# Patient Record
Sex: Female | Born: 1955 | Race: Black or African American | Hispanic: No | Marital: Married | Smoking: Never smoker
Health system: Southern US, Community
[De-identification: ages and names within clinical notes are randomized; demographics above are authoritative.]

## PROBLEM LIST (undated history)

## (undated) DIAGNOSIS — K589 Irritable bowel syndrome without diarrhea: Secondary | ICD-10-CM

## (undated) DIAGNOSIS — C73 Malignant neoplasm of thyroid gland: Secondary | ICD-10-CM

## (undated) DIAGNOSIS — E669 Obesity, unspecified: Secondary | ICD-10-CM

## (undated) DIAGNOSIS — E042 Nontoxic multinodular goiter: Secondary | ICD-10-CM

## (undated) DIAGNOSIS — K219 Gastro-esophageal reflux disease without esophagitis: Secondary | ICD-10-CM

## (undated) DIAGNOSIS — R1319 Other dysphagia: Secondary | ICD-10-CM

## (undated) DIAGNOSIS — E209 Hypoparathyroidism, unspecified: Secondary | ICD-10-CM

## (undated) DIAGNOSIS — J309 Allergic rhinitis, unspecified: Secondary | ICD-10-CM

## (undated) DIAGNOSIS — E890 Postprocedural hypothyroidism: Secondary | ICD-10-CM

## (undated) HISTORY — DX: Gastro-esophageal reflux disease without esophagitis: K21.9

## (undated) HISTORY — DX: Allergic rhinitis, unspecified: J30.9

## (undated) HISTORY — DX: Obesity, unspecified: E66.9

## (undated) HISTORY — DX: Nontoxic multinodular goiter: E04.2

## (undated) HISTORY — DX: Irritable bowel syndrome, unspecified: K58.9

## (undated) HISTORY — DX: Other dysphagia: R13.19

## (undated) HISTORY — DX: Postprocedural hypothyroidism: E89.0

## (undated) HISTORY — DX: Hypoparathyroidism, unspecified: E20.9

## (undated) HISTORY — DX: Malignant neoplasm of thyroid gland: C73

## (undated) HISTORY — PX: OTHER SURGICAL HISTORY: SHX169

---

## 1998-12-06 ENCOUNTER — Other Ambulatory Visit: Admission: RE | Admit: 1998-12-06 | Discharge: 1998-12-06 | Payer: Self-pay | Admitting: *Deleted

## 2000-02-26 ENCOUNTER — Other Ambulatory Visit: Admission: RE | Admit: 2000-02-26 | Discharge: 2000-02-26 | Payer: Self-pay | Admitting: Gynecology

## 2001-10-25 ENCOUNTER — Other Ambulatory Visit: Admission: RE | Admit: 2001-10-25 | Discharge: 2001-10-25 | Payer: Self-pay | Admitting: Obstetrics and Gynecology

## 2002-09-24 ENCOUNTER — Ambulatory Visit (HOSPITAL_COMMUNITY): Admission: RE | Admit: 2002-09-24 | Discharge: 2002-09-24 | Payer: Self-pay | Admitting: Family Medicine

## 2002-09-24 ENCOUNTER — Encounter: Payer: Self-pay | Admitting: Family Medicine

## 2003-10-24 ENCOUNTER — Other Ambulatory Visit: Admission: RE | Admit: 2003-10-24 | Discharge: 2003-10-24 | Payer: Self-pay | Admitting: Family Medicine

## 2004-08-14 ENCOUNTER — Encounter: Admission: RE | Admit: 2004-08-14 | Discharge: 2004-11-12 | Payer: Self-pay | Admitting: Family Medicine

## 2004-10-24 ENCOUNTER — Other Ambulatory Visit: Admission: RE | Admit: 2004-10-24 | Discharge: 2004-10-24 | Payer: Self-pay | Admitting: Family Medicine

## 2005-12-24 ENCOUNTER — Encounter: Admission: RE | Admit: 2005-12-24 | Discharge: 2005-12-24 | Payer: Self-pay | Admitting: Family Medicine

## 2006-07-02 ENCOUNTER — Other Ambulatory Visit: Admission: RE | Admit: 2006-07-02 | Discharge: 2006-07-02 | Payer: Self-pay | Admitting: Family Medicine

## 2006-08-08 ENCOUNTER — Encounter: Payer: Self-pay | Admitting: Endocrinology

## 2007-01-31 ENCOUNTER — Encounter: Admission: RE | Admit: 2007-01-31 | Discharge: 2007-01-31 | Payer: Self-pay | Admitting: Orthopaedic Surgery

## 2007-08-09 ENCOUNTER — Other Ambulatory Visit: Admission: RE | Admit: 2007-08-09 | Discharge: 2007-08-09 | Payer: Self-pay | Admitting: Family Medicine

## 2007-08-12 ENCOUNTER — Encounter: Payer: Self-pay | Admitting: Endocrinology

## 2007-08-12 ENCOUNTER — Encounter: Admission: RE | Admit: 2007-08-12 | Discharge: 2007-08-12 | Payer: Self-pay | Admitting: Family Medicine

## 2007-08-12 HISTORY — PX: OTHER SURGICAL HISTORY: SHX169

## 2007-08-26 ENCOUNTER — Encounter: Admission: RE | Admit: 2007-08-26 | Discharge: 2007-08-26 | Payer: Self-pay | Admitting: Gastroenterology

## 2007-10-14 ENCOUNTER — Encounter (INDEPENDENT_AMBULATORY_CARE_PROVIDER_SITE_OTHER): Payer: Self-pay | Admitting: Interventional Radiology

## 2007-10-14 ENCOUNTER — Encounter: Admission: RE | Admit: 2007-10-14 | Discharge: 2007-10-14 | Payer: Self-pay | Admitting: Endocrinology

## 2007-10-14 ENCOUNTER — Encounter: Payer: Self-pay | Admitting: Endocrinology

## 2007-10-14 ENCOUNTER — Other Ambulatory Visit: Admission: RE | Admit: 2007-10-14 | Discharge: 2007-10-14 | Payer: Self-pay | Admitting: Interventional Radiology

## 2007-10-14 HISTORY — PX: BIOPSY THYROID: PRO38

## 2007-11-07 ENCOUNTER — Encounter: Payer: Self-pay | Admitting: Endocrinology

## 2007-11-08 DIAGNOSIS — K219 Gastro-esophageal reflux disease without esophagitis: Secondary | ICD-10-CM

## 2007-11-08 HISTORY — DX: Gastro-esophageal reflux disease without esophagitis: K21.9

## 2007-11-09 ENCOUNTER — Ambulatory Visit: Payer: Self-pay | Admitting: Endocrinology

## 2007-11-09 DIAGNOSIS — R1319 Other dysphagia: Secondary | ICD-10-CM

## 2007-11-09 HISTORY — DX: Other dysphagia: R13.19

## 2007-11-14 ENCOUNTER — Encounter (INDEPENDENT_AMBULATORY_CARE_PROVIDER_SITE_OTHER): Payer: Self-pay | Admitting: *Deleted

## 2007-12-14 ENCOUNTER — Encounter: Payer: Self-pay | Admitting: Endocrinology

## 2008-02-09 ENCOUNTER — Encounter (HOSPITAL_BASED_OUTPATIENT_CLINIC_OR_DEPARTMENT_OTHER): Payer: Self-pay | Admitting: General Surgery

## 2008-02-10 ENCOUNTER — Inpatient Hospital Stay (HOSPITAL_COMMUNITY): Admission: AD | Admit: 2008-02-10 | Discharge: 2008-02-11 | Payer: Self-pay | Admitting: General Surgery

## 2008-02-15 ENCOUNTER — Telehealth (INDEPENDENT_AMBULATORY_CARE_PROVIDER_SITE_OTHER): Payer: Self-pay | Admitting: *Deleted

## 2008-02-20 ENCOUNTER — Telehealth: Payer: Self-pay | Admitting: Endocrinology

## 2008-02-20 ENCOUNTER — Ambulatory Visit: Payer: Self-pay | Admitting: Endocrinology

## 2008-02-20 DIAGNOSIS — E209 Hypoparathyroidism, unspecified: Secondary | ICD-10-CM

## 2008-02-20 DIAGNOSIS — E890 Postprocedural hypothyroidism: Secondary | ICD-10-CM

## 2008-02-20 DIAGNOSIS — C73 Malignant neoplasm of thyroid gland: Secondary | ICD-10-CM

## 2008-02-20 HISTORY — DX: Postprocedural hypothyroidism: E89.0

## 2008-02-20 HISTORY — DX: Hypoparathyroidism, unspecified: E20.9

## 2008-02-20 HISTORY — DX: Malignant neoplasm of thyroid gland: C73

## 2008-02-27 ENCOUNTER — Ambulatory Visit: Payer: Self-pay | Admitting: Endocrinology

## 2008-02-27 DIAGNOSIS — R079 Chest pain, unspecified: Secondary | ICD-10-CM | POA: Insufficient documentation

## 2008-03-01 ENCOUNTER — Telehealth (INDEPENDENT_AMBULATORY_CARE_PROVIDER_SITE_OTHER): Payer: Self-pay | Admitting: *Deleted

## 2008-03-13 ENCOUNTER — Ambulatory Visit: Payer: Self-pay | Admitting: Endocrinology

## 2008-03-22 ENCOUNTER — Encounter (HOSPITAL_COMMUNITY): Admission: RE | Admit: 2008-03-22 | Discharge: 2008-04-26 | Payer: Self-pay | Admitting: Endocrinology

## 2008-03-23 ENCOUNTER — Ambulatory Visit: Payer: Self-pay | Admitting: Endocrinology

## 2008-03-23 ENCOUNTER — Telehealth: Payer: Self-pay | Admitting: Endocrinology

## 2008-03-23 ENCOUNTER — Telehealth (INDEPENDENT_AMBULATORY_CARE_PROVIDER_SITE_OTHER): Payer: Self-pay | Admitting: *Deleted

## 2008-03-23 DIAGNOSIS — R519 Headache, unspecified: Secondary | ICD-10-CM | POA: Insufficient documentation

## 2008-03-23 DIAGNOSIS — R51 Headache: Secondary | ICD-10-CM | POA: Insufficient documentation

## 2008-03-23 DIAGNOSIS — K1120 Sialoadenitis, unspecified: Secondary | ICD-10-CM | POA: Insufficient documentation

## 2008-03-23 LAB — CONVERTED CEMR LAB: Amylase: 989 units/L — ABNORMAL HIGH (ref 27–131)

## 2008-04-06 ENCOUNTER — Ambulatory Visit: Payer: Self-pay | Admitting: Endocrinology

## 2008-04-27 ENCOUNTER — Ambulatory Visit: Payer: Self-pay | Admitting: Endocrinology

## 2008-04-27 LAB — CONVERTED CEMR LAB: TSH: 0.1 microintl units/mL — ABNORMAL LOW (ref 0.35–5.50)

## 2008-05-23 ENCOUNTER — Ambulatory Visit: Payer: Self-pay | Admitting: Endocrinology

## 2008-05-23 LAB — CONVERTED CEMR LAB: TSH: 0.03 microintl units/mL — ABNORMAL LOW (ref 0.35–5.50)

## 2008-05-28 ENCOUNTER — Ambulatory Visit: Payer: Self-pay | Admitting: Endocrinology

## 2008-05-28 DIAGNOSIS — R209 Unspecified disturbances of skin sensation: Secondary | ICD-10-CM | POA: Insufficient documentation

## 2008-05-28 LAB — CONVERTED CEMR LAB
Calcium, Total (PTH): 9.1 mg/dL (ref 8.4–10.5)
PTH: 26.1 pg/mL (ref 14.0–72.0)

## 2008-06-06 LAB — CONVERTED CEMR LAB
Basophils Absolute: 0 10*3/uL (ref 0.0–0.1)
Basophils Relative: 0.7 % (ref 0.0–3.0)
Eosinophils Absolute: 0.1 10*3/uL (ref 0.0–0.7)
Eosinophils Relative: 1.8 % (ref 0.0–5.0)
Folate: 12 ng/mL
HCT: 37.6 % (ref 36.0–46.0)
Hemoglobin: 12.7 g/dL (ref 12.0–15.0)
Lymphocytes Relative: 30.3 % (ref 12.0–46.0)
MCHC: 33.8 g/dL (ref 30.0–36.0)
MCV: 83.7 fL (ref 78.0–100.0)
Monocytes Absolute: 0.3 10*3/uL (ref 0.1–1.0)
Monocytes Relative: 6.6 % (ref 3.0–12.0)
Neutro Abs: 2.2 10*3/uL (ref 1.4–7.7)
Neutrophils Relative %: 60.6 % (ref 43.0–77.0)
Platelets: 267 10*3/uL (ref 150–400)
RBC: 4.5 M/uL (ref 3.87–5.11)
RDW: 12.4 % (ref 11.5–14.6)
Sed Rate: 19 mm/hr (ref 0–22)
Vitamin B-12: 298 pg/mL (ref 211–911)
WBC: 3.8 10*3/uL — ABNORMAL LOW (ref 4.5–10.5)

## 2008-06-20 ENCOUNTER — Telehealth (INDEPENDENT_AMBULATORY_CARE_PROVIDER_SITE_OTHER): Payer: Self-pay | Admitting: *Deleted

## 2008-08-15 ENCOUNTER — Ambulatory Visit: Payer: Self-pay | Admitting: Endocrinology

## 2008-08-15 LAB — CONVERTED CEMR LAB
Amylase: 111 units/L (ref 27–131)
TSH: 23.75 microintl units/mL — ABNORMAL HIGH (ref 0.35–5.50)

## 2008-08-16 ENCOUNTER — Encounter: Payer: Self-pay | Admitting: Endocrinology

## 2008-08-16 LAB — CONVERTED CEMR LAB
Calcium, Total (PTH): 8.7 mg/dL (ref 8.4–10.5)
PTH: 34.5 pg/mL (ref 14.0–72.0)

## 2008-08-20 ENCOUNTER — Telehealth (INDEPENDENT_AMBULATORY_CARE_PROVIDER_SITE_OTHER): Payer: Self-pay | Admitting: *Deleted

## 2008-08-24 ENCOUNTER — Telehealth (INDEPENDENT_AMBULATORY_CARE_PROVIDER_SITE_OTHER): Payer: Self-pay | Admitting: *Deleted

## 2008-08-27 ENCOUNTER — Encounter (HOSPITAL_COMMUNITY): Admission: RE | Admit: 2008-08-27 | Discharge: 2008-11-25 | Payer: Self-pay | Admitting: Endocrinology

## 2008-08-30 ENCOUNTER — Telehealth (INDEPENDENT_AMBULATORY_CARE_PROVIDER_SITE_OTHER): Payer: Self-pay | Admitting: *Deleted

## 2008-08-31 ENCOUNTER — Telehealth: Payer: Self-pay | Admitting: Endocrinology

## 2008-09-04 ENCOUNTER — Other Ambulatory Visit: Admission: RE | Admit: 2008-09-04 | Discharge: 2008-09-04 | Payer: Self-pay | Admitting: Family Medicine

## 2008-09-13 ENCOUNTER — Telehealth (INDEPENDENT_AMBULATORY_CARE_PROVIDER_SITE_OTHER): Payer: Self-pay | Admitting: *Deleted

## 2008-10-01 ENCOUNTER — Telehealth (INDEPENDENT_AMBULATORY_CARE_PROVIDER_SITE_OTHER): Payer: Self-pay | Admitting: *Deleted

## 2008-12-13 ENCOUNTER — Encounter: Admission: RE | Admit: 2008-12-13 | Discharge: 2008-12-13 | Payer: Self-pay | Admitting: Orthopaedic Surgery

## 2008-12-14 ENCOUNTER — Ambulatory Visit: Payer: Self-pay | Admitting: Endocrinology

## 2008-12-14 ENCOUNTER — Telehealth (INDEPENDENT_AMBULATORY_CARE_PROVIDER_SITE_OTHER): Payer: Self-pay | Admitting: *Deleted

## 2008-12-14 DIAGNOSIS — M2550 Pain in unspecified joint: Secondary | ICD-10-CM | POA: Insufficient documentation

## 2008-12-17 ENCOUNTER — Telehealth (INDEPENDENT_AMBULATORY_CARE_PROVIDER_SITE_OTHER): Payer: Self-pay | Admitting: *Deleted

## 2008-12-28 ENCOUNTER — Telehealth (INDEPENDENT_AMBULATORY_CARE_PROVIDER_SITE_OTHER): Payer: Self-pay | Admitting: *Deleted

## 2008-12-28 ENCOUNTER — Ambulatory Visit: Payer: Self-pay | Admitting: Endocrinology

## 2008-12-28 LAB — CONVERTED CEMR LAB: Amylase: 193 units/L — ABNORMAL HIGH (ref 27–131)

## 2009-01-01 ENCOUNTER — Telehealth (INDEPENDENT_AMBULATORY_CARE_PROVIDER_SITE_OTHER): Payer: Self-pay | Admitting: *Deleted

## 2009-01-04 ENCOUNTER — Telehealth (INDEPENDENT_AMBULATORY_CARE_PROVIDER_SITE_OTHER): Payer: Self-pay | Admitting: *Deleted

## 2009-01-14 ENCOUNTER — Other Ambulatory Visit: Admission: RE | Admit: 2009-01-14 | Discharge: 2009-01-14 | Payer: Self-pay | Admitting: Family Medicine

## 2009-01-21 ENCOUNTER — Telehealth: Payer: Self-pay | Admitting: Endocrinology

## 2009-01-22 ENCOUNTER — Encounter: Payer: Self-pay | Admitting: Endocrinology

## 2009-03-04 ENCOUNTER — Ambulatory Visit: Payer: Self-pay | Admitting: Internal Medicine

## 2009-03-20 ENCOUNTER — Ambulatory Visit: Payer: Self-pay | Admitting: Endocrinology

## 2009-03-20 LAB — CONVERTED CEMR LAB: TSH: 0.03 microintl units/mL — ABNORMAL LOW (ref 0.35–5.50)

## 2009-06-19 ENCOUNTER — Encounter: Payer: Self-pay | Admitting: Endocrinology

## 2009-06-20 ENCOUNTER — Ambulatory Visit: Payer: Self-pay | Admitting: Endocrinology

## 2009-06-24 LAB — CONVERTED CEMR LAB
Calcium, Total (PTH): 8.7 mg/dL (ref 8.4–10.5)
PTH: 18.3 pg/mL (ref 14.0–72.0)

## 2009-07-10 ENCOUNTER — Ambulatory Visit: Payer: Self-pay | Admitting: Endocrinology

## 2009-07-10 DIAGNOSIS — M5412 Radiculopathy, cervical region: Secondary | ICD-10-CM | POA: Insufficient documentation

## 2009-08-12 ENCOUNTER — Telehealth: Payer: Self-pay | Admitting: Endocrinology

## 2009-09-05 ENCOUNTER — Encounter: Payer: Self-pay | Admitting: Endocrinology

## 2009-09-09 ENCOUNTER — Telehealth: Payer: Self-pay | Admitting: Endocrinology

## 2009-12-10 ENCOUNTER — Ambulatory Visit: Payer: Self-pay | Admitting: Endocrinology

## 2009-12-10 LAB — CONVERTED CEMR LAB
Calcium, Total (PTH): 8.2 mg/dL — ABNORMAL LOW (ref 8.4–10.5)
PTH: 32.9 pg/mL (ref 14.0–72.0)
TSH: 0.18 microintl units/mL — ABNORMAL LOW (ref 0.35–5.50)
Thyroglobulin Ab: <30 (ref 0.0–60.0)

## 2010-01-13 ENCOUNTER — Encounter: Admission: RE | Admit: 2010-01-13 | Discharge: 2010-01-13 | Payer: Self-pay | Admitting: Gastroenterology

## 2010-04-07 ENCOUNTER — Telehealth (INDEPENDENT_AMBULATORY_CARE_PROVIDER_SITE_OTHER): Payer: Self-pay | Admitting: *Deleted

## 2010-09-07 LAB — CONVERTED CEMR LAB
Sed Rate: 23 mm/hr — ABNORMAL HIGH (ref 0–22)
TSH: 0.11 microintl units/mL — ABNORMAL LOW (ref 0.35–5.50)
TSH: 45.22 microintl units/mL — ABNORMAL HIGH (ref 0.35–5.50)

## 2010-09-11 NOTE — Progress Notes (Signed)
Summary: Thyroid meds  Phone Note Call from Patient   Caller: Patient [phone] Summary of Call: pt called stating that she was seen at her PCP and had her labs done. Pt's PCP told pt that she is taking too much Thyroid medication (labs faxed and on MD's desk for review). pt is requesting MD review and adjust her medications if needed. please advise Initial call taken by: Margaret Pyle, CMA,  September 09, 2009 9:21 AM  Follow-up for Phone Call        i called pt 09/09/09.  no answer.  i left message.  the tsh should be kept slightly below the "normal" range, as doing so has been found to reduce the recurrence of thyroid cancer.  please continue same dosage. Follow-up by: Minus Breeding MD,  September 09, 2009 5:24 PM

## 2010-09-11 NOTE — Consult Note (Signed)
Summary: [facility]   Imported By: Esmeralda Links D'jimraou 01/04/2008 15:27:31  _____________________________________________________________________  External Attachment:    Type:   Image     Comment:   External Document

## 2010-09-11 NOTE — Progress Notes (Signed)
  Phone Note Call from Patient Call back at Home Phone [phone]   Caller: [phone] Call For: dr ellision  Complaint: Chest Pain Summary of Call: per Audria Nine call  at the end of day her energy levels drops ,what should she do about this , saw dr elllision today , should she  increase her sythroid .. what does dr ellsion recommended Initial call taken by: Shelbie Proctor,  Dec 14, 2008 2:38 PM  Follow-up for Phone Call        it will take 1-2 weeks for today's adjustment to help you feel better.   Follow-up by: Minus Breeding MD,  Dec 14, 2008 2:45 PM  Additional Follow-up for Phone Call Additional follow up Details #1::        pt states dr ellsion made no changes to her medication does understand what he means by- it will take 1-2 weeks for today's adjustment to help you feel better.  Additional Follow-up by: Shelbie Proctor,  Dec 14, 2008 3:58 PM    Additional Follow-up for Phone Call Additional follow up Details #2::    i left message on phone-tree Follow-up by: Minus Breeding MD,  Dec 14, 2008 4:10 PM  Additional Follow-up for Phone Call Additional follow up Details #3:: Details for Additional Follow-up Action Taken: called pt to inform to listen to the phone msg Additional Follow-up by: Shelbie Proctor,  Dec 14, 2008 4:33 PM

## 2010-09-11 NOTE — Progress Notes (Signed)
Summary: ? about appt  Phone Note Call from Patient Call back at [facility] Phone [phone]   Caller: [phone] Call For: dr ellsion Summary of Call: per Audria Nine call want to know if it is nessary to keep her appt on Next Appointment: 01/02/2009,7:45 am,ENDOCRINOLOGY .. she was just seen  Last Appointment: 12/28/2008,4:00 pm,  Initial call taken by: Shelbie Proctor,  Jan 01, 2009 9:54 AM  Follow-up for Phone Call        no, please return here nov 2010 Follow-up by: Minus Breeding MD,  Jan 01, 2009 11:08 AM  Additional Follow-up for Phone Call Additional follow up Details #1::        called pt to inform  spoke with pt to inform sharon scheduled pt  Additional Follow-up by: Shelbie Proctor,  Jan 01, 2009 11:23 AM

## 2010-09-11 NOTE — Progress Notes (Signed)
Summary: Iodine med  Phone Note Call from Patient Call back at Home Phone [phone]   Caller: Patient Summary of Call: Per pt was advised ok to take an Iodine pill. She would like  to know how long would she need to be away from people while on med. She plans to return to work 02/27/08 Initial call taken by: Rock Nephew CMA,  February 20, 2008 1:52 PM  Follow-up for Phone Call        3 days Follow-up by: Minus Breeding MD,  February 20, 2008 1:58 PM  Additional Follow-up for Phone Call Additional follow up Details #1::        Patient notify Additional Follow-up by: Rock Nephew CMA,  February 20, 2008 3:09 PM

## 2010-09-11 NOTE — Assessment & Plan Note (Signed)
Summary: ANXIETY/$50 /NWS   Vital Signs:  Patient Profile:   55 Years Old Female Weight:      177.4 pounds O2 Sat:      97 % O2 treatment:    Room Air Temp:     97.0 degrees F oral Pulse rate:   71 / minute BP sitting:   140 / 88  (left arm) Cuff size:   regular  Pt. in pain?   no  Vitals Entered By: Orlan Leavens (May 23, 2008 10:29 AM)                  Referred by:  Arvilla Market  Chief Complaint:  Anxiety.  History of Present Illness: takes synthroid as rx'ed. she states few weeks of cold intolerance and anxiety.  no insomnia.    Prior Medications Reviewed Using: Patient Recall  Prior Medication List:  CLIDINIUM-CHLORDIAZEPOXIDE 2.5-5 MG  CAPS (CLIDINIUM-CHLORDIAZEPOXIDE) TAKE 1 by mouth QID PRN CALCITRIOL 0.25 MCG  CAPS (CALCITRIOL) TAKE 2 by mouth QD [facility] CALCIUM CARBONATE/VIT D 600-125 MG-UNIT  TABS (CALCIUM CARBONATE-VITAMIN D) TAKE 2 QID SYNTHROID 125 MCG  TABS (LEVOTHYROXINE SODIUM) qd   Updated Prior Medication List: CLIDINIUM-CHLORDIAZEPOXIDE 2.5-5 MG  CAPS (CLIDINIUM-CHLORDIAZEPOXIDE) TAKE 1 by mouth QID PRN CALCITRIOL 0.25 MCG  CAPS (CALCITRIOL) TAKE 2 by mouth QD [facility] CALCIUM CARBONATE/VIT D 600-125 MG-UNIT  TABS (CALCIUM CARBONATE-VITAMIN D) TAKE 2 QID SYNTHROID 125 MCG  TABS (LEVOTHYROXINE SODIUM) qd  Current Allergies (reviewed today): ! 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 Qd  Other Orders: T-Parathyroid Hormone, Intact w/ Calcium ([id number])   Patient Instructions: 1)  reduce synthroid to 112/d. 2)  f/u bp with dr Arvilla Market.   Prescriptions: LEVOTHYROXINE SODIUM 112 MCG TABS (LEVOTHYROXINE SODIUM) qd  #30 x 5   Entered and Authorized by:   Minus Breeding MD   Signed by:   Minus Breeding MD on 05/23/2008   Method used:   [facility]  [address] [phone]* (retail)       [address]  [place], [place]  [address]       Ph: [phone] or [phone]       Fax: [phone]   RxID:   [id number]  ]

## 2010-09-11 NOTE — Assessment & Plan Note (Signed)
Summary: Vickie Valdez  ST[facility]   Vital Signs:  Patient profile:   55 year old female Height:      68 inches (172.72 cm) Weight:      182.6 pounds (83.00 kg) BMI:     27.86 O2 Sat:      96 % Temp:     98.5 degrees F (36.94 degrees C) oral Pulse rate:   76 / minute BP sitting:   120 / 80  (left arm) Cuff size:   regular  Vitals Entered By: Orlan Leavens (Dec 14, 2008 8:34 AM) Valdez Assessment Patient in Valdez? yes     Location: [facility] Type: aching   Referring Provider:  Arvilla Market Primary Provider:  Arvilla Market   History of Present Illness: 2 days of Valdez at the flexor surfaces of the elbows.  no associated numbness.  unable to cite precip cause she feels very tired at the end of the day.  Current Medications (verified): 1)  Clidinium-Chlordiazepoxide 2.5-5 Mg  Caps (Clidinium-Chlordiazepoxide) .... Take 1 By Mouth Qid Prn 2)  Levothyroxine Sodium 150 Mcg Tabs (Levothyroxine Sodium) .... Qd 3)  Protonix 40 Mg Tbec (Pantoprazole Sodium) .... Take 1 By Mouth Qd 4)  Multivitamins  Tabs (Multiple Vitamin) .... Take 1 By Mouth Qd  Allergies (verified): 1)  ! Penicillin  Comments:  Nurse/Medical Assistant: The patient's medications and allergies were reviewed with the patient and were updated in the Medication and Allergy Lists. 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         denies rash  Physical Exam  General:  normal  appearance.   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 Take 1 by mouth qd 2)  Multivitamins Tabs (Multiple vitamin) .... Take 1 by mouth qd  Other Orders: T- * Misc. Laboratory test [phone]) EKG w/ Interpretation ([address]) TLB-TSH (Thyroid Stimulating Hormone) (84443-TSH) TLB-Sedimentation Rate (ESR) (85652-ESR) Est. Patient Level IV (86578)  Patient Instructions: 1)  i gave pt the option of keeping tsh supppressed (preferred), or try keeping it normal if you think this would help you feel better. 2)  ret as scheduled 3)  i offered ref ortho, but it is best to f/u with dr Arvilla Market.

## 2010-09-11 NOTE — Progress Notes (Signed)
  Phone Note Call from Patient Call back at Home Phone [phone]   Caller: [phone] Call For: dr ellsion Summary of Call: per Audria Nine call heard a phone msg by dr ellsion that he was oing to increase her LEVOTHYROXINE SODIUM 150 MCG TABS (LEVOTHYROXINE SODIUM) ...[facility] [address]* (retail) [address] [place], [place]  [address] Ph: [phone] or [phone] Fax: [phone] Initial call taken by: Shelbie Proctor,  Dec 17, 2008 10:18 AM  Follow-up for Phone Call        sent Follow-up by: Minus Breeding MD,  Dec 17, 2008 10:47 AM  Additional Follow-up for Phone Call Additional follow up Details #1::        called pt to inform rx was sent to the pharmacy Additional Follow-up by: Shelbie Proctor,  Dec 17, 2008 11:00 AM    New/Updated Medications: LEVOTHYROXINE SODIUM 137 MCG TABS (LEVOTHYROXINE SODIUM) 1 qd   Prescriptions: LEVOTHYROXINE SODIUM 137 MCG TABS (LEVOTHYROXINE SODIUM) 1 qd  #30 x 11   Entered and Authorized by:   Minus Breeding MD   Signed by:   Minus Breeding MD on 12/17/2008   Method used:   [facility]ronically to        [facility] [phone]* (retail)       [address]       [place], [place]  [address]       Ph: [phone] or [phone]       Fax: [phone]   RxID:   [id number]

## 2010-09-11 NOTE — Progress Notes (Signed)
  Phone Note Other Incoming   Request: Send information Summary of Call: Request for records received from [facility]. Request forwarded to [facility].     

## 2010-09-11 NOTE — Progress Notes (Signed)
Summary: Appt  Phone Note From Other Clinic   Caller: Kelena Garrow Call For: Dr. Everardo All Summary of Call: Call from Dr. Leonie Man. This is concerning a patient of Dr. Everardo All. Dr. Everardo All sent the patient  to Dr. Lurene Shadow for surgery. She needs an appointment with Dr. Everardo All as soon as possible. This was her first visit to review pathology. She has papillary thyroid cancer. Patient is aware of diagnosis. The patient's telephone number is [phone].  Initial call taken by: Jerilynn Mages, MA,  February 15, 2008 3:22 PM  Follow-up for Phone Call        ov 7/9/9 Follow-up by: Minus Breeding MD,  February 15, 2008 4:23 PM  Additional Follow-up for Phone Call Additional follow up Details #1::        Pt called and scheduled appt w/Dr Everardo All on monday 7/13 Additional Follow-up by: Verdell Face,  February 16, 2008 9:05 AM

## 2010-09-11 NOTE — Progress Notes (Signed)
  Phone Note Refill Request Message from:  Fax from Pharmacy on August 12, 2009 2:19 PM  Refills Requested: Medication #1:  SYNTHROID 112 MCG TABS 1 qd   Dosage confirmed as above?Dosage Confirmed Initial call taken by: Josph Macho CMA,  August 12, 2009 2:20 PM    Prescriptions: SYNTHROID 112 MCG TABS (LEVOTHYROXINE SODIUM) 1 qd  #90 x 2   Entered by:   Josph Macho CMA   Authorized by:   Minus Breeding MD   Signed by:   Josph Macho CMA on 08/12/2009   Method used:   Faxed to ...       [facility] (mail-order)             , [place]         Ph:        Fax: [phone]   RxID:   [id number]

## 2010-09-11 NOTE — Assessment & Plan Note (Signed)
Summary: DISCOMFORT IN FRONT OF NECK/TINGLING IN LEG /ALL WEEK/NWS $50   Vital Signs:  Patient Profile:   55 Years Old Female Weight:      184.8 pounds Temp:     98.4 degrees F oral Pulse rate:   73 / minute BP sitting:   118 / 78  (left arm) Cuff size:   regular  Pt. in pain?   no  Vitals Entered By: Orlan Leavens (April 06, 2008 8:20 AM)              Is Patient Diabetic? No     Referred by:  Arvilla Market  Chief Complaint:  Discomfort in front of neck/ also been having tingling in (L) leg .  History of Present Illness: pt has much less pain and swelling at the parotid areas, but has slight pain at the anterior neck. now back on synthroid x 10d.     Current Allergies: ! PENICILLIN  Past Medical History:    Reviewed history from 02/20/2008 and no changes required:       HYPOTHYROIDISM, POSTSURGICAL (ICD-244.0)       HYPOPARATHYROIDISM (ICD-252.1)       THYROID CANCER (ICD-193)       6/09: thyroidectomy for 10 mm papillary ca, follicular variant       8/09: 104 [facility] i-131 rx       8/09: "superscan" pos only at the neck       OTHER DYSPHAGIA (ICD-787.29)       GOITER, MULTINODULAR (ICD-241.1)       GERD (ICD-530.81)            Review of Systems  The patient denies fever.     Physical Exam  General:     well developed, well nourished, in no acute distress Neck:     no masses, thyromegaly, tenderness, or abnormal cervical nodes    Impression & Recommendations:  Problem # 1:  HYPOTHYROIDISM, POSTSURGICAL (ICD-244.0) Assessment: Improved  Problem # 2:  SIALADENITIS (ICD-527.2) Assessment: Improved  Problem # 3:  THYROID CANCER (ICD-193)   Patient Instructions: 1)  tsh 30d 244.0 2)  ret 5 mos, when she will be due for a thyrogen scan   ]

## 2010-09-11 NOTE — Consult Note (Signed)
Summary: Dr. Arvilla Market  Dr. Arvilla Market   Imported By: Briant Cedar 11/17/2007 14:38:04  _____________________________________________________________________  External Attachment:    Type:   Image     Comment:   External Document

## 2010-09-11 NOTE — Progress Notes (Signed)
  Phone Note Call from Patient Call back at Home Phone [phone]   Caller: [phone] Summary of Call: pt requseting a pill  medication to help her relaxe when she does her xray body scan..............[facility] [phone]* (retail) [address] [place], [place]  [address] Ph: [phone] or [phone] Fax: [phone] Initial call taken by: Shelbie Proctor,  August 24, 2008 12:59 PM  Follow-up for Phone Call        good news--this test is easy--sedation is not needed Follow-up by: Minus Breeding MD,  August 24, 2008 1:40 PM  Additional Follow-up for Phone Call Additional follow up Details #1::        called pt  to inform pt made aware Additional Follow-up by: Shelbie Proctor,  August 24, 2008 1:46 PM

## 2010-09-11 NOTE — Assessment & Plan Note (Signed)
Summary: not feeling well/lb   Vital Signs:  Patient profile:   55 year old female Height:      63 inches Weight:      188.38 pounds BMI:     33.49 O2 Sat:      98 % on Room air Temp:     98.4 degrees F oral Pulse rate:   75 / minute BP sitting:   110 / 70  (left arm) Cuff size:   large  Vitals Entered By: Lucious Groves (Dec 10, 2009 2:00 PM)  O2 Flow:  Room air CC: Not feeling well--Pt c/o fatigue and upper arm achiness. Pt also states that she is "jittery"./kb Is Patient Diabetic? No Pain Assessment Patient in pain? no        Referring Provider:  Pincus Badder Primary Provider:  Pincus Badder  CC:  Not feeling well--Pt c/o fatigue and upper arm achiness. Pt also states that she is "jittery"./kb.  History of Present Illness: the status of at least 3 ongoing medical problems is addressed today: papillary adenocarcinoma of the thyroid:  pt says she does not notice any nodule at the neck. hypoparathyroidism:  she does not take a ca++ tab, other than what is in her multivitamin. postoperative hypothyroidism:  she takes synthroid as rx'ed.  she has fatigue.  Current Medications (verified): 1)  Clidinium-Chlordiazepoxide 2.5-5 Mg  Caps (Clidinium-Chlordiazepoxide) .... Take 1 By Mouth Qid Prn 2)  Multivitamins  Tabs (Multiple Vitamin) .... Take 1 By Mouth Qd 3)  Synthroid 112 Mcg Tabs (Levothyroxine Sodium) .Marland Kitchen.. 1 Qd 4)  Dexilant 60 Mg Cpdr (Dexlansoprazole) .Marland Kitchen.. 1 By Mouth Qd  Allergies (verified): 1)  ! 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  Social History: Reviewed history from 03/04/2009 and no changes required. married works for a and Social research officer, government, as an Production designer, theatre/television/film Never Smoked Alcohol use-no Drug use-no Regular exercise-yes  Review of Systems       The patient complains of weight gain.         denies neck pain.  Physical Exam  General:  obese.   Neck:  a healed scar is present.  i do not appreciate a nodule in the thyroid or elsewhere in the neck  Additional Exam:   FastTSH              [L]  0.18 uIU/mL   Thyroglobulin Antibody    <30.0 U/mL                  0.0-60.0 Thyroglobulin             <0.2 ng/mL                  0.0-55.0 Parathyroid Hormone       32.9 pg/mL                  14.0-72.0 Calcium              [L]  8.2 mg/dL    Impression & Recommendations:  Problem # 1:  THYROID CANCER (  ICD-193) stage 1.  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 1 by mouth qd  Other Orders: T-Thyroglobulin Panel ([address], [phone]) T-Parathyroid Hormone, Intact w/ Calcium ([id number]) TLB-TSH (Thyroid Stimulating Hormone) (84443-TSH) Est. Patient Level IV (72536)  Patient Instructions: 1)  tests are being ordered for you today.  a few days after the test(s), please call [phone] to hear your test results. 2)  pending the test results, please continue the same medications for now. 3)  if blood tests are normal, please schedule a follow-up appointment in 1 year. 4)  (update: i left message on phone-tree:  rx as we discussed)

## 2010-09-11 NOTE — Assessment & Plan Note (Signed)
Summary: RIGHT CHEEK FEEL LIKE IT HAS OPEN CUT,NOT SURE/$50/PN   Vital Signs:  Patient Profile:   55 Years Old Female Weight:      185.8 pounds Temp:     97.7 degrees F oral Pulse rate:   68 / minute BP sitting:   110 / 82  (right arm) Cuff size:   regular  Pt. in pain?   yes    Location:   (R) SIDE CHEEK  Vitals Entered By: Orlan Leavens (August 15, 2008 8:37 AM)                  Referred by:  Arvilla Market PCP:  Arvilla Market  Chief Complaint:  PAIN ON (R) SIDE CHEEK.  History of Present Illness: takes synthroid 112/d as rx'ed. she had adjuvant i-131 rx for stage 1 papillary adenocarcinoma of the thyroid mid-2009. she feels well except for pain at right parotid area.  she heard this could be caused by i-131 rx.    Prior Medications Reviewed Using: Patient Recall  Updated Prior Medication List: CLIDINIUM-CHLORDIAZEPOXIDE 2.5-5 MG  CAPS (CLIDINIUM-CHLORDIAZEPOXIDE) TAKE 1 by mouth QID PRN CALCITRIOL 0.25 MCG  CAPS (CALCITRIOL) TAKE 2 by mouth QD [facility] CALCIUM CARBONATE/VIT D 600-125 MG-UNIT  TABS (CALCIUM CARBONATE-VITAMIN D) TAKE 2 QID LEVOTHYROXINE SODIUM 75 MCG TABS (LEVOTHYROXINE SODIUM) qd  Current Allergies (reviewed today): ! 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     Physical Exam  General:     well developed, well nourished, in no acute distress Head:     ?  tenderness at right parotid area Neck:     heled surgical scar is present. 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  sxs are not thyroid-related  Problem # 3:  HYPOTHYROIDISM, POSTSURGICAL (ICD-244.0) needs increased rx   Other Orders: T-Parathyroid Hormone, Intact w/ Calcium ([id number])   Patient Instructions: 1)  thyrogen body scan 2)  after scan, increase synthroid to 150/d 3)  ok to take just a usual ca++ and vit supplemet advised for all women of your age. 4)  ret june 2010   ]

## 2010-09-11 NOTE — Assessment & Plan Note (Signed)
Summary: PER DR ELLISON/LMB   Vital Signs:  Patient Profile:   55 Years Old Female Temp:     97.2 degrees F oral Pulse rate:   64 / minute BP sitting:   130 / 82  (left arm) Cuff size:   regular  Vitals Entered By: Orlan Leavens (March 23, 2008 11:09 AM)                 Referred by:  Arvilla Market  Chief Complaint:  SWELLING AFTER TAKING IODINE PILL.  History of Present Illness: pt says 1 day of swelling, slightly painful, of binamdibular areas.  pt feels this was caused by i-131 rx yesterday.    Current Allergies: ! PENICILLIN  Past Medical History:    Reviewed history from 02/20/2008 and no changes required:       HYPOTHYROIDISM, POSTSURGICAL (ICD-244.0)       HYPOPARATHYROIDISM (ICD-252.1)       THYROID CANCER (ICD-193)       6/09: thyroidectomy for 10 mm papillary ca, follicular variant       OTHER DYSPHAGIA (ICD-787.29)       GOITER, MULTINODULAR (ICD-241.1)       GERD (ICD-530.81)            Review of Systems  The patient denies fever.     Physical Exam  General:     obese.   Head:     head is normocephalic eyes: no scleral icterus no periorbital swelling perrl external ears are normal nose normal externally mouth has no lesion, including normal tongue pt states pain upon palpation of parotid areas.  Additional Exam:     AMYLASE              [H]  989 U/L        Impression & Recommendations:  Problem # 1:  SIALADENITIS (ICD-527.2) exceedingly unlikely related to i-131 rx, give the short time interval after the dose  Medications Added to Medication List This Visit: 1)  Synthroid 125 Mcg Tabs (Levothyroxine sodium) .... Qd  Other Orders: TLB-Amylase (82150-AMYL)   Patient Instructions: 1)  i discussed with radiologists--they agree that short time interval after dosage make cause and effect with i-31 very unlikely 2)  resume synthroid 125/d, in 4d 3)  tylenol prn 4)  i offered to check amylase again next week 5)  ret 6  weeks   Prescriptions: SYNTHROID 125 MCG  TABS (LEVOTHYROXINE SODIUM) qd  #30 x 2   Entered and Authorized by:   Minus Breeding MD   Signed by:   Minus Breeding MD on 03/23/2008   Method used:   [facility] ...       [facility] [id number]       [address]       [place], [place]  [address]       Ph: [phone] or [phone]       Fax: [phone]   RxID:   [id number]  ]

## 2010-09-11 NOTE — Assessment & Plan Note (Signed)
Summary: f/u appt/#/cd   Vital Signs:  Patient profile:   56 year old female Height:      63 inches (160.02 cm) Weight:      185.13 pounds (84.15 kg) Temp:     97.6 degrees F (36.44 degrees C) oral Pulse rate:   74 / minute BP sitting:   132 / 88  (left arm) Cuff size:   regular  Vitals Entered By: Josph Macho CMA (July 10, 2009 8:23 AM) CC: Follow-up visit/Device wouldn't read pts oxygen/ pt states she will get flu shot at her primary doctors/ [facility] Is Patient Diabetic? No   Referring Provider:  Arvilla Market Primary Provider:  Arvilla Market  CC:  Follow-up visit/Device wouldn't read pts oxygen/ pt states she will get flu shot at her primary doctors/ [facility].  History of Present Illness: pt states 1 week of moderate pain rad from the posterior neck to the right upper arm. no local injury.  she says dr Cleophas Dunker has told her in the past that she had "ruptured disc in the neck."  Current Medications (verified): 1)  Clidinium-Chlordiazepoxide 2.5-5 Mg  Caps (Clidinium-Chlordiazepoxide) .... Take 1 By Mouth Qid Prn 2)  Nexium 40 Mg Pack (Esomeprazole Magnesium) .Marland Kitchen.. 1 Bid 3)  Multivitamins  Tabs (Multiple Vitamin) .... Take 1 By Mouth Qd 4)  Synthroid 112 Mcg Tabs (Levothyroxine Sodium) .Marland Kitchen.. 1 Qd 5)  Protonix  Allergies (verified): 1)  ! Penicillin  Past History:  Past Medical History: Last updated: 06/20/2009 HYPOTHYROIDISM, POSTSURGICAL (ICD-244.0) HYPOPARATHYROIDISM (ICD-252.1) THYROID CANCER (ICD-193) stage 1 papillary adenocarcinoma, follicular variant 6/09: thyroidectomy for 10 mm papillary ca, follicular variant 8/09: [facility] i-131 rx 8/09: "superscan" pos only at the neck 1/10:  tg undetectable (ab neg) 1/10: neg thyrogen scan 5/10: tg undetectable (ab neg) 11/10: tg undetectable (ab neg) OTHER DYSPHAGIA (ICD-787.29) GOITER, MULTINODULAR (ICD-241.1) GERD (ICD-530.81)  Review of Systems       denies headache  Physical Exam  General:  obese.  no distress  Neck:   supple.  nontender. Msk:  strength is normal throughout the upper extremities Neurologic:  sensation is intact to touch throughout the upper extremities. Additional Exam:  mri may, 2010: oa only   Impression & Recommendations:  Problem # 1:  CERVICAL RADICULOPATHY, RIGHT (ICD-723.4) Assessment Comment Only  Other Orders: Physical Therapy Referral (PT) Est. Patient Level III (04540)  Patient Instructions: 1)  we discussed therapeutic alternatives: 2)  pt requests referral back to physical therapy--i agree, and i have requested appointment for her.

## 2010-09-11 NOTE — Assessment & Plan Note (Signed)
Summary: both legs feel funny/$50/pn   Vital Signs:  Patient Profile:   55 Years Old Female Weight:      177.0 pounds Temp:     99.0 degrees F oral Pulse rate:   70 / minute BP sitting:   122 / 86  (left arm) Cuff size:   regular  Pt. in pain?   no  Vitals Entered By: Orlan Leavens (May 28, 2008 9:55 AM)                  Referred by:  Arvilla Market  Chief Complaint:  Legs feel funny.  History of Present Illness: few days tingling of the legs, but no associated pain.  unable to cite precip factor.    Prior Medications Reviewed Using: Patient Recall  Prior Medication List:  CLIDINIUM-CHLORDIAZEPOXIDE 2.5-5 MG  CAPS (CLIDINIUM-CHLORDIAZEPOXIDE) TAKE 1 by mouth QID PRN CALCITRIOL 0.25 MCG  CAPS (CALCITRIOL) TAKE 2 by mouth QD [facility] CALCIUM CARBONATE/VIT D 600-125 MG-UNIT  TABS (CALCIUM CARBONATE-VITAMIN D) TAKE 2 QID LEVOTHYROXINE SODIUM 112 MCG TABS (LEVOTHYROXINE SODIUM) qd   Updated Prior Medication List: CLIDINIUM-CHLORDIAZEPOXIDE 2.5-5 MG  CAPS (CLIDINIUM-CHLORDIAZEPOXIDE) TAKE 1 by mouth QID PRN CALCITRIOL 0.25 MCG  CAPS (CALCITRIOL) TAKE 2 by mouth QD [facility] CALCIUM CARBONATE/VIT D 600-125 MG-UNIT  TABS (CALCIUM CARBONATE-VITAMIN D) TAKE 2 QID LEVOTHYROXINE SODIUM 112 MCG TABS (LEVOTHYROXINE SODIUM) qd  Current Allergies (reviewed today): ! PENICILLIN  Past Medical History:    Reviewed history from 05/23/2008 and no changes required:       HYPOTHYROIDISM, POSTSURGICAL (ICD-244.0)       HYPOPARATHYROIDISM (ICD-252.1)       THYROID CANCER (ICD-193)       stage 1 papillary adenocarcinoma, follicular variant       6/09: thyroidectomy for 10 mm papillary ca, follicular variant       8/09: [facility] i-131 rx       8/09: "superscan" pos only at the neck       OTHER DYSPHAGIA (ICD-787.29)       GOITER, MULTINODULAR (ICD-241.1)       GERD (ICD-530.81)            Review of Systems       denies arthralgias   Physical Exam  General:     healthy appearing.     Pulses:     dorsalis pedis intact bilat.  no carotid bruit  Extremities:     no deformity.  no ulcer on the feet.  feet are of normal color and temp.  no edema  Neurologic:     sensation is intact to touch on the feet     Impression & Recommendations:  Problem # 1:  NUMBNESS (ICD-782.0)   Patient Instructions: 1)  labs 2)  skip 1 day of synthroid, then resume at 112/d 3)  i offered pncv, but you are better off seeing if this persists first   ]

## 2010-09-11 NOTE — Progress Notes (Signed)
Summary: medication  Phone Note Call from Patient Call back at Home Phone [phone]   Caller: [phone] Call For: dr ellsion Summary of Call: per Audria Nine call was prescribe the LEVOTHYROXINE SODIUM 125 MCG TABS (LEVOTHYROXINE SODIUM) take 1 by mouth qd made her jittery" ,took LEVOTHYROXINE SODIUM 75 mg was fine .. could Dr ellsion changed her medication to the 75 instead of the 125 per Audria Nine  Initial call taken by: Shelbie Proctor,  June 20, 2008 8:38 AM  Follow-up for Phone Call        i reduced to 75/d--i sent to pharmacy ret 30d Follow-up by: Minus Breeding MD,  June 20, 2008 1:39 PM  Additional Follow-up for Phone Call Additional follow up Details #1::        called Audria Nine to inform pt made aware Additional Follow-up by: Shelbie Proctor,  June 20, 2008 2:04 PM    New/Updated Medications: LEVOTHYROXINE SODIUM 75 MCG TABS (LEVOTHYROXINE SODIUM) qd   Prescriptions: LEVOTHYROXINE SODIUM 75 MCG TABS (LEVOTHYROXINE SODIUM) qd  #30 x 2   Entered and Authorized by:   Minus Breeding MD   Signed by:   Minus Breeding MD on 06/20/2008   Method used:   [phone]       [address]       [place], [place]  [address]       Ph: [phone] or [phone]       Fax: [phone]   RxID:   [id number]

## 2010-09-11 NOTE — Assessment & Plan Note (Signed)
Summary: feeling achey/sae/cd   Vital Signs:  Patient profile:   55 year old female Height:      68 inches Weight:      181 pounds BMI:     27.62 O2 Sat:      98 % on Room air Temp:     97.4 degrees F oral Pulse rate:   70 / minute Pulse rhythm:   regular BP sitting:   140 / 82  (left arm) Cuff size:   large  Vitals Entered By: Rock Nephew CMA (March 04, 2009 4:56 PM)  O2 Flow:  Room air  Primary Care Provider:  Yetta Barre   History of Present Illness: She c/o's worsening GERD that is causing sharp pain across her chest.  Dyspepsia History:      She has no alarm features of dyspepsia including no history of melena, hematochezia, dysphagia, persistent vomiting, or involuntary weight loss > 5%.  There is a prior history of GERD.  She notes that there have been breakthrough symptoms despite maximum H-2 blocker or PPI therapy.  The patient does not have a prior history of documented ulcer disease.  The dominant symptom is heartburn or acid reflux.  An H-2 blocker medication is currently being taken.  She notes that the symptoms have improved with the H-2 blocker therapy.  Symptoms have persisted after 4 weeks of H-2 blocker treatment.     Preventive Screening-Counseling & Management  Alcohol-Tobacco     Smoking Status: never  Caffeine-Diet-Exercise     Does Patient Exercise: yes      Drug Use:  no.    Current Medications (verified): 1)  Clidinium-Chlordiazepoxide 2.5-5 Mg  Caps (Clidinium-Chlordiazepoxide) .... Take 1 By Mouth Qid Prn 2)  Protonix 40 Mg Tbec (Pantoprazole Sodium) .... Take 1 By Mouth Qd 3)  Multivitamins  Tabs (Multiple Vitamin) .... Take 1 By Mouth Qd 4)  Levothyroxine Sodium 125 Mcg Tabs (Levothyroxine Sodium) .Marland Kitchen.. 1 Qd  Allergies (verified): 1)  ! Penicillin  Past History:  Past Medical History: Reviewed history from 12/14/2008 and no changes required. HYPOTHYROIDISM, POSTSURGICAL (ICD-244.0) HYPOPARATHYROIDISM (ICD-252.1) THYROID CANCER  (ICD-193) stage 1 papillary adenocarcinoma, follicular variant 6/09: thyroidectomy for 10 mm papillary ca, follicular variant 8/09: [facility] i-131 rx 8/09: "superscan" pos only at the neck 1/10:  tg undetectable (ab neg) 1/10: neg thyrogen scan OTHER DYSPHAGIA (ICD-787.29) GOITER, MULTINODULAR (ICD-241.1) GERD (ICD-530.81)  Past Surgical History: Reviewed history from 11/08/2007 and no changes required. Thyroid Ultrasound ( 08/12/2007) Thyroid Bx (10/14/2007)  Family History: Reviewed history from 11/09/2007 and no changes required. sister had hyperthyroidism  Social History: Reviewed history from 11/09/2007 and no changes required. married works for a and Social research officer, government, as an Production designer, theatre/television/film Never Smoked Alcohol use-no Drug use-no Regular exercise-yes Drug Use:  no Does Patient Exercise:  yes  Review of Systems       The patient complains of chest pain and severe indigestion/heartburn.  The patient denies anorexia, fever, hoarseness, syncope, dyspnea on exertion, peripheral edema, prolonged cough, headaches, hemoptysis, abdominal pain, melena, hematochezia, hematuria, and enlarged lymph nodes.   Endo:  Denies cold intolerance, excessive hunger, excessive thirst, excessive urination, heat intolerance, polyuria, and weight change.  Physical Exam  General:  alert, well-developed, well-nourished, well-hydrated, cooperative to examination, good hygiene, and overweight-appearing.   Head:  normocephalic and atraumatic.   Eyes:  vision grossly intact and pupils equal.   Mouth:  Oral mucosa and oropharynx without lesions or exudates.  Teeth in good repair. Neck:  supple, full ROM,  no masses, no cervical lymphadenopathy, and no neck tenderness.   Lungs:  Normal respiratory effort, chest expands symmetrically. Lungs are clear to auscultation, no crackles or wheezes. Heart:  Normal rate and regular rhythm. S1 and S2 normal without gallop, murmur, click, rub or other extra sounds. Abdomen:   Bowel sounds positive,abdomen soft and non-tender without masses, organomegaly or hernias noted. Rectal:  No external abnormalities noted. Normal sphincter tone. No rectal masses or tenderness. heme negative stool. Msk:  No deformity or scoliosis noted of thoracic or lumbar spine.   Neurologic:  No cranial nerve deficits noted. Station and gait are normal. Plantar reflexes are down-going bilaterally. DTRs are symmetrical throughout. Sensory, motor and coordinative functions appear intact. Skin:  Intact without suspicious lesions or rashes Cervical Nodes:  No lymphadenopathy noted Psych:  Cognition and judgment appear intact. Alert and cooperative with normal attention span and concentration. No apparent delusions, illusions, hallucinations Additional Exam:  EKG shows NSR with low voltage c/w body habitus, there are no Q waves and no abnormal ST/T segments.   Impression & Recommendations:  Problem # 1:  CHEST PAIN UNSPECIFIED (ICD-786.50) Assessment Unchanged  Orders: EKG w/ Interpretation (93000)  Problem # 2:  GERD (ICD-530.81) Assessment: Deteriorated  Her updated medication list for this problem includes:    Clidinium-chlordiazepoxide 2.5-5 Mg Caps (Clidinium-chlordiazepoxide) .Marland Kitchen... Take 1 by mouth qid prn    Nexium 40 Mg Pack (Esomeprazole magnesium) ..... Once daily for heartburn  Orders: EKG w/ Interpretation (93000)  Problem # 3:  HYPOTHYROIDISM, POSTSURGICAL (ICD-244.0) Assessment: Unchanged  Her updated medication list for this problem includes:    Levothyroxine Sodium 125 Mcg Tabs (Levothyroxine sodium) .Marland Kitchen... 1 qd  Orders: EKG w/ Interpretation (93000)  Complete Medication List: 1)  Clidinium-chlordiazepoxide 2.5-5 Mg Caps (Clidinium-chlordiazepoxide) .... Take 1 by mouth qid prn 2)  Nexium 40 Mg Pack (Esomeprazole magnesium) .... Once daily for heartburn 3)  Multivitamins Tabs (Multiple vitamin) .... Take 1 by mouth qd 4)  Levothyroxine Sodium 125 Mcg Tabs  (Levothyroxine sodium) .Marland Kitchen.. 1 qd  Patient Instructions: 1)  Please schedule a follow-up appointment in 2 weeks. 2)  Avoid foods high in acid (tomatoes, citrus juices, spicy foods). Avoid eating within two hours of lying down or before exercising. Do not over eat; try smaller more frequent meals. Elevate head of bed twelve inches when sleeping. Prescriptions: NEXIUM 40 MG PACK (ESOMEPRAZOLE MAGNESIUM) once daily for heartburn  #40 x 0   Entered and Authorized by:   Etta Grandchild MD   Signed by:   Etta Grandchild MD on 03/04/2009   Method used:   Historical   RxID:   [id number]

## 2010-09-11 NOTE — Progress Notes (Signed)
Summary: swelling  Phone Note Call from Patient Call back at Home Phone [phone]   Caller: [phone] Call For: dr ellsion Summary of Call: per pt call took the idoine radioactive tx 131 the pill have some swelling on her earlobe ... Dr ellsion Dr Paulo Fruit called also stated pt had 131 study , and  dr Colonel Bald will call and talk to you about this .Marland KitchenMarland KitchenAudria Nine had not sob swelling in lip Initial call taken by: Shelbie Proctor,  March 23, 2008 8:03 AM  Follow-up for Phone Call        i spoke to dr Sherrie Mustache and called pt.  pt told to come to ov now. Follow-up by: Minus Breeding MD,  March 23, 2008 9:00 AM  Additional Follow-up for Phone Call Additional follow up Details #1::        APPT SCHEDULE. Additional Follow-up by: Orlan Leavens,  March 23, 2008 9:03 AM

## 2010-09-11 NOTE — Assessment & Plan Note (Signed)
Summary: 1 MO ROV /$50 /NWS   Vital Signs:  Patient profile:   55 year old female Height:      68 inches Weight:      181 pounds BMI:     27.62 Temp:     97.2 degrees F oral Pulse rate:   88 / minute BP sitting:   120 / 86  (left arm) Cuff size:   regular  Vitals Entered By: Bill Salinas CMA (Dec 28, 2008 4:26 PM)  Referring Provider:  Arvilla Market Primary Provider:  Arvilla Market   History of Present Illness: pt states 1 week slight swelling at bilateral parotid areas.  no associated pain.    pt states she has palpitations at night. pt does not notice any nodule at the neck  Current Medications (verified): 1)  Clidinium-Chlordiazepoxide 2.5-5 Mg  Caps (Clidinium-Chlordiazepoxide) .... Take 1 By Mouth Qid Prn 2)  Protonix 40 Mg Tbec (Pantoprazole Sodium) .... Take 1 By Mouth Qd 3)  Multivitamins  Tabs (Multiple Vitamin) .... Take 1 By Mouth Qd 4)  Levothyroxine Sodium 137 Mcg Tabs (Levothyroxine Sodium) .Marland Kitchen.. 1 Qd  Allergies (verified): 1)  ! Penicillin  Past History:  Past Medical History:    HYPOTHYROIDISM, POSTSURGICAL (ICD-244.0)    HYPOPARATHYROIDISM (ICD-252.1)    THYROID CANCER (ICD-193)    stage 1 papillary adenocarcinoma, follicular variant    6/09: thyroidectomy for 10 mm papillary ca, follicular variant    8/09: [facility] i-131 rx    8/09: "superscan" pos only at the neck    1/10:  tg undetectable (ab neg)    1/10: neg thyrogen scan    OTHER DYSPHAGIA (ICD-787.29)    GOITER, MULTINODULAR (ICD-241.1)    GERD (ICD-530.81)     (12/14/2008)  Review of Systems  The patient denies fever.         denies sore throat  Physical Exam  General:  normal appearance.   Head:  head: no deformity eyes: no periorbital swelling, no proptosis external nose and ears are normal mouth: no lesion seen the parotid areas are not swollen or tender to my exam Ears:  TM's intact and clear with normal canals with grossly normal hearing.   Neck:  a healed scar is present.  i do not  appreciate a nodule in the thyroid or elsewhere in the neck  Additional Exam:  Amylase              [H]  193 U/L     Impression & Recommendations:  Problem # 1:  SIALADENITIS (ICD-527.2) not due to i-131.   Problem # 2:  HYPOTHYROIDISM, POSTSURGICAL (ICD-244.0)  Problem # 3:  THYROID CANCER (ICD-193) papillary stage 1  Medications Added to Medication List This Visit: 1)  Levothyroxine Sodium 125 Mcg Tabs (Levothyroxine sodium) .Marland Kitchen.. 1 qd 2)  Azithromycin 500 Mg Tabs (Azithromycin) .Marland Kitchen.. 1 qd  Other Orders: TLB-Amylase (82150-AMYL) Est. Patient Level IV ([address])  Patient Instructions: 1)  azithromycin 500/day, x 6 days 2)  please f/u with dr Arvilla Market if sxs persist 3)  reduce levothyroxine to 125 micrograms/day 4)  return here as scheduled Prescriptions: AZITHROMYCIN 500 MG TABS (AZITHROMYCIN) 1 qd  #6 x 0   Entered and Authorized by:   Minus Breeding MD   Signed by:   Minus Breeding MD on 12/29/2008   Method used:   [facility]        [facility] [phone][facility] (retail)       [address]  [place], [place]  [address]       Ph: [phone] or [phone]       Fax: [phone]   RxID:   [id number] LEVOTHYROXINE SODIUM 125 MCG TABS (LEVOTHYROXINE SODIUM) 1 qd  #30 x 5   Entered and Authorized by:   Minus Breeding MD   Signed by:   Minus Breeding MD on 12/28/2008   Method used:   [facility]        [facility] [phone]* (retail)       [address]       [place], [place]  [address]       Ph: [phone] or [phone]       Fax: [phone]   RxID:   [id number]

## 2010-09-11 NOTE — Progress Notes (Signed)
  Phone Note Call from Patient Call back at [facility] Phone [phone]   Caller: [phone] Call For: dr Everardo All Summary of Call:  pt requseting a pill  medication to help her .Relax w hen she does her xray body scan   [facility] [phone]* (retail) [address] [place], [place]  [address] Ph: [phone] or [phone] Fax: [phone] Initial call taken by: Shelbie Proctor,  August 30, 2008 9:12 AM  Follow-up for Phone Call        please see 08/24/08 message Follow-up by: Minus Breeding MD,  August 30, 2008 12:33 PM  Additional Follow-up for Phone Call Additional follow up Details #1::        Again gave the same msg as before on 08-24-2008 left msg on vm  Additional Follow-up by: Shelbie Proctor,  August 30, 2008 1:59 PM

## 2010-09-11 NOTE — Assessment & Plan Note (Signed)
Summary: 1 WK ROV /NWS $50   Vital Signs:  Patient Profile:   55 Years Old Female Weight:      183 pounds Temp:     97.1 degrees F oral Pulse rate:   67 / minute BP sitting:   128 / 84  (left arm)  Vitals Entered By: Tora Perches (February 27, 2008 8:13 AM)                 Referred by:  Arvilla Market  Chief Complaint:  f/u.  History of Present Illness: 1 day of intermittent pressure-quality pain across the chest, lasting 1 minute at a time.  no assoc sxs. on synthroid still. has some tingling of the extremities    Current Allergies (reviewed today): ! PENICILLIN  Past Medical History:    Reviewed history from 02/20/2008 and no changes required:       HYPOTHYROIDISM, POSTSURGICAL (ICD-244.0)       HYPOPARATHYROIDISM (ICD-252.1)       THYROID CANCER (ICD-193)       6/09: thyroidectomy for 10 mm papillary ca, follicular variant       OTHER DYSPHAGIA (ICD-787.29)       GOITER, MULTINODULAR (ICD-241.1)       GERD (ICD-530.81)            Review of Systems  The patient denies unusual weight change and dyspnea on exertion.     Physical Exam  General:     well developed, well nourished, in no acute distress Chest Wall:     nontender Lungs:     clear to auscultation.  no respiratory distress  Heart:     regular rate and rhythm, S1, S2 without murmurs, rubs, gallops, or clicks    Impression & Recommendations:  Problem # 1:  CHEST PAIN UNSPECIFIED (ICD-786.50)  Problem # 2:  HYPOPARATHYROIDISM (ICD-252.1)  Problem # 3:  HYPOTHYROIDISM, POSTSURGICAL (ICD-244.0)   Patient Instructions: 1)  treadmill cardiac nuc study--[facility] 2)  stop synthroid 3)  f/u calcium at dr ballen 4)  tsh 14 days 244.0   ]

## 2010-09-11 NOTE — Consult Note (Signed)
Summary: Swelling in front of LT ear/[facility]  Swelling in front of LT ear/[facility]   Imported By: Sherian Rein 01/31/2009 10:48:10  _____________________________________________________________________  External Attachment:    Type:   Image     Comment:   External Document

## 2010-09-11 NOTE — Letter (Signed)
Summary: [facility]  [address] [place], [place] [address]   Phone: [phone]  Fax: [phone]    Today's Date: Dec 10, 2009  Name of Patient: Vickie Valdez  The above named patient had a medical visit today at:  1:45 pm.  Please take this into consideration when reviewing the time away from work/school.    Special Instructions:  [  ] None  [ XX ] To be off the remainder of today, returning to the normal work / school schedule tomorrow.  [  ] To be off until the next scheduled appointment on ______________________.  [  ] Other ________________________________________________________________ ________________________________________________________________________   Sincerely yours,   Kadelyn Dimascio A. Everardo All, MD

## 2010-09-11 NOTE — Assessment & Plan Note (Signed)
Summary: THYROID GOITER/NODULE-PER JULIE/DR CASSIANO/PC PHY-[facility]-$50-NE.Marland KitchenMarland Kitchen   Vital Signs:  Patient Profile:   55 Years Old Female Weight:      189.0 pounds Temp:     99.4 degrees F oral Pulse rate:   77 / minute BP sitting:   118 / 83  (left arm) Cuff size:   regular  Vitals Entered By: Orlan Leavens (November 09, 2007 2:24 PM)                 Visit Type:  Consult Referred by:  Arvilla Market  Chief Complaint:  thyroid.  History of Present Illness: was noted on recent routine physical exan to have enlarged thyroid. pt doesn't notice the thyroid enlargement, but has few months of solid dysphagia.    Current Allergies: ! PENICILLIN  Past Medical History:    Reviewed history from 11/08/2007 and no changes required:       GERD   Family History:    Reviewed history and no changes required:       sister had hyperthyroidism  Social History:    Reviewed history and no changes required:       married       works for a and Social research officer, government, as an Production designer, theatre/television/film    Review of Systems       The patient complains of weight gain.  The patient denies dyspnea on exhertion.     Physical Exam  General:     obese.   Eyes:     no proptosis Neck:     small multinodular goiter Skin:     normal texture and temp.  not diaphoretic  Cervical Nodes:     no significant adenopathy Psych:     alert and cooperative; normal mood and affect; normal attention span and concentration Additional Exam:     outside test results are reviewed: thyroid US (08/12/07): multinodular goiter tsh=0.98 thyroid nodules cytology: follicular lesion, both isthmus and right nodules    Impression & Recommendations:  Problem # 1:  GOITER, MULTINODULAR (ICD-241.1)  Orders: Surgical Referral (Surgery) Consultation Level III (09811)   Problem # 2:  GERD (ICD-530.81)  Problem # 3:  dysphagia much more likely due to #2 than #1  Medications Added to Medication List This Visit: 1)  Clidinium-chlordiazepoxide  2.5-5 Mg Caps (Clidinium-chlordiazepoxide) .... Take 1 by mouth qid prn 2)  Hyoscyamine Sulfate 0.125 Mg Tabs (Hyoscyamine sulfate) .... Take 1 by mouth once daily prn  Other Orders: Gastroenterology Referral (GI)   Patient Instructions: 1)  we discussed the risk of malignancy in these nodules.  i advised pt to consider diagnostic right thyroid lobectomy. 2)  ref [facility] 3)  30 moi ov    ]  Appended Document: THYROID GOITER/NODULE-PER JULIE/DR CASSIANO/PC PHY-[facility]-$50-NE... FAXED NOTES TO DR CASSINO @ [phone]/LMB

## 2010-09-11 NOTE — Assessment & Plan Note (Signed)
Summary: FU PER DR JONES/ $50 /NWS   Vital Signs:  Patient profile:   55 year old female Height:      68 inches Weight:      179 pounds BMI:     27.32 O2 Sat:      99 % on Room air Temp:     97.1 degrees F oral Pulse rate:   67 / minute BP sitting:   128 / 82  (left arm) Cuff size:   regular  Vitals Entered By: Bill Salinas CMA (March 20, 2009 10:06 AM)  O2 Flow:  Room air CC: pt here for f/u office visit and was switched from protonix to nexium per Dr Yetta Barre. PT states she really doesn't see any differance she still has fluttering and nervousness in her chest and she also c/o bloating/ ab   Referring Miyako Oelke:  Arvilla Market Primary Veanna Dower:  Yetta Barre  CC:  pt here for f/u office visit and was switched from protonix to nexium per Dr Yetta Barre. PT states she really doesn't see any differance she still has fluttering and nervousness in her chest and she also c/o bloating/ ab.  History of Present Illness: pt says "something is going on in my chest.  not exertional, but is worse with anxiety.  no help with nexium. cardiac nuclear study was neg 2009. pt also c/o anxiety. she does not notice any nodule in the neck.  Current Medications (verified): 1)  Clidinium-Chlordiazepoxide 2.5-5 Mg  Caps (Clidinium-Chlordiazepoxide) .... Take 1 By Mouth Qid Prn 2)  Nexium 40 Mg Pack (Esomeprazole Magnesium) .... Once Daily For Heartburn 3)  Multivitamins  Tabs (Multiple Vitamin) .... Take 1 By Mouth Qd 4)  Levothyroxine Sodium 125 Mcg Tabs (Levothyroxine Sodium) .Marland Kitchen.. 1 Qd  Allergies (verified): 1)  ! 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         denies dysphagia  Physical Exam  General:  normal appearance.   Neck:  a healed scar is present.  i do not appreciate a nodule in the thyroid or elsewhere in the neck  Additional Exam:   FastTSH              [L]  0.03 uIU/mL    Impression & Recommendations:  Problem # 1:  CHEST PAIN UNSPECIFIED (ICD-786.50) ? 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 1 bid 2)  Synthroid 112 Mcg Tabs (Levothyroxine sodium) .Marland Kitchen.. 1 qd  Other Orders: TLB-TSH (Thyroid Stimulating Hormone) (84443-TSH) Est. Patient Level IV (19147)  Patient Instructions: 1)  for thyroid cancer, return mid-2011. 2)  come in nov 2010 for thyroglobulin panel 193 3)  cc dr Ewing Schlein 4)  increase nexium to 40 mg two times a day 5)  please see dr Ewing Schlein for f/u 6)  (update: i left message on phone-tree:  reduce synthroid to 112 micrograms/day 7)  recheck tsh in november also 244.0 Prescriptions: SYNTHROID 112 MCG TABS (LEVOTHYROXINE SODIUM) 1  qd  #30 x 5   Entered and Authorized by:   Minus Breeding MD   Signed by:   Minus Breeding MD on 03/20/2009   Method used:   [facility]        [phone][facility] (retail)       [address]       [place], [place]  [address]       Ph: [phone] or [phone]       Fax: [phone]   RxID:   [id number]

## 2010-09-11 NOTE — Assessment & Plan Note (Signed)
Summary: pt walked in req appt w/sae today/cd   Vital Signs:  Patient profile:   55 year old female Height:      68 inches (172.72 cm) Weight:      184 pounds (83.64 kg) O2 Sat:      98 % on Room air Temp:     97.1 degrees F (36.17 degrees C) oral Pulse rate:   72 / minute BP sitting:   138 / 92  (left arm) Cuff size:   large  Vitals Entered By: Josph Macho CMA (June 20, 2009 10:25 AM)  O2 Flow:  Room air CC: Thyroid is low/ pt had appt at primary doctor and stated the blood work came back low. I called [facility] and they are faxing her blood tests over/ pt states she is not taking Nexium/ CF Is Patient Diabetic? No   Primary Sharod Petsch:  Arvilla Market  CC:  Thyroid is low/ pt had appt at primary doctor and stated the blood work came back low. I called [facility] and they are faxing her blood tests over/ pt states she is not taking Nexium/ CF.  History of Present Illness: pt takes synthroid as rx'ed. he has h/o papillary adenocarcinoma of the thyroid, follicaular variant.  she does not notice any nodule at the head or neck she c/o weight gain, headache, myalgias, and flatulence.  Current Medications (verified): 1)  Clidinium-Chlordiazepoxide 2.5-5 Mg  Caps (Clidinium-Chlordiazepoxide) .... Take 1 By Mouth Qid Prn 2)  Nexium 40 Mg Pack (Esomeprazole Magnesium) .Marland Kitchen.. 1 Bid 3)  Multivitamins  Tabs (Multiple Vitamin) .... Take 1 By Mouth Qd 4)  Synthroid 112 Mcg Tabs (Levothyroxine Sodium) .Marland Kitchen.. 1 Qd 5)  Protonix  Allergies (verified): 1)  ! 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         she denies neck pain.  Physical Exam  General:  normal appearance.   Head:  head: no deformity eyes: no periorbital swelling, no proptosis external nose and ears are normal mouth: no lesion seen Neck:  a healed scar is present.  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  a few days after the test(s), please call [phone] to hear your test results. 2)  these symptoms are not related  to the thyroid. 3)  Please schedule a follow-up appointment in 6 months. 4)  same medications

## 2010-09-11 NOTE — Progress Notes (Signed)
----   Converted from flag ---- ---- 09/10/2008 1:02 PM, Minus Breeding MD wrote: looks to me as though she is ok until june for appt  ---- 09/10/2008 11:08 AM, Verdell Face wrote: Hi Dr Everardo All,  This pt is sched to see you tomorrow at 8am.She is calling to see if she needs to keep this appt as you left her a phone tree message regarding her test results. This appt was made prior to her tests and she has the results so wants to know if she needs o.v. with you tomorrow? Please let me know.....thanks.  Elnita Maxwell    [phone] ------------------------------

## 2010-09-11 NOTE — Progress Notes (Signed)
Summary: Appointment  Phone Note Outgoing Call   Call placed by: Dagoberto Reef,  August 20, 2008 1:35 PM Call placed to: Specialist Summary of Call: Dr Everardo All, do you wont total body scan? Hospital was not sure how to schedule. Thanks Initial call taken by: Dagoberto Reef,  August 20, 2008 1:37 PM  Follow-up for Phone Call        yes Follow-up by: Minus Breeding MD,  August 20, 2008 2:13 PM

## 2010-09-11 NOTE — Progress Notes (Signed)
Summary: jaw pain  Phone Note Call from Patient Call back at Home Phone [phone]   Caller: [phone] Call For: dr ellsion Summary of Call: per Vickie Valdez call c/o of jaw discomfort right side , want to know if she should see Dr Lorane Gell or her  Dentist Initial call taken by: Shelbie Proctor,  October 01, 2008 9:40 AM  Follow-up for Phone Call        dentist or dr Arvilla Market Follow-up by: Minus Breeding MD,  October 01, 2008 9:47 AM  Additional Follow-up for Phone Call Additional follow up Details #1::        called pt to inform pt made aware Additional Follow-up by: Shelbie Proctor,  October 01, 2008 9:58 AM

## 2010-09-11 NOTE — Letter (Signed)
Summary: [facility] Consult Scheduled Letter  [facility]  [address] [place], [place] [address]   Phone: [phone]  Fax: [phone]      11/14/2007 MRN: [id number]  [facility] [address] [place], [place]  [address]    Dear Ms. Clinton Sawyer,      We have scheduled an appointment for you.  At the recommendation of Dr.Ellison, we have scheduled you a consult with Dr Luisa Hart on 12/14/07 at 1:50pm.  Their phone number is [phone].  If this appointment day and time is not convenient for you, please feel free to call the office of the doctor you are being referred to at the number listed above and reschedule the appointment.     [facility] [address] [place] [address]   Please arrive at 1:30pm for you appointment.    Thank you,  Patient Care Coordinator [facility]

## 2010-09-11 NOTE — Progress Notes (Signed)
  Phone Note Call from Patient Call back at [facility] Phone [phone]   Caller: Patient Call For: Dr Everardo All Summary of Call: Pt wants to be referred to specialist as discussed w/Dr Everardo All at last ov. Pt experiencing same symptoms as before, jaw swelling. Initial call taken by: Verdell Face,  Jan 04, 2009 3:59 PM  Follow-up for Phone Call        referral is sent Follow-up by: Minus Breeding MD,  Jan 04, 2009 10:32 PM  Additional Follow-up for Phone Call Additional follow up Details #1::        called pt to inform referral will be done left pt msg Additional Follow-up by: Shelbie Proctor,  January 08, 2009 8:05 AM

## 2010-09-11 NOTE — Progress Notes (Signed)
Summary: ?   Phone Note Call from Patient Call back at Home Phone [phone]   Caller: [phone] Call For: dr ellsion Summary of Call: per pt call need to know is she to restart taking the synthroid, going to have her tsh done 8-4. pt stated she is still taking the Calcium, 600 p d one a day is this ok that was an order from another doctor Initial call taken by: Shelbie Proctor,  March 01, 2008 9:49 AM  Follow-up for Phone Call        do not take synthroid until advised to do so. blood test week after next  Follow-up by: Minus Breeding MD,  March 01, 2008 12:32 PM  Additional Follow-up for Phone Call Additional follow up Details #1::        March 01, 2008 2:54 PM  called pt to inform left msg on machine Additional Follow-up by: Shelbie Proctor,  March 01, 2008 2:57 PM

## 2010-09-11 NOTE — Assessment & Plan Note (Signed)
Summary: FU AFTER HOSPITAL/$50/PN   Vital Signs:  Patient Profile:   55 Years Old Female Weight:      185.8 pounds Temp:     98.2 degrees F oral Pulse rate:   78 / minute BP sitting:   128 / 93  (right arm) Cuff size:   regular  Pt. in pain?   no  Vitals Entered By: Orlan Leavens (February 20, 2008 9:47 AM)                  Referred by:  Arvilla Market  Chief Complaint:  HOSP FOLLOW-UP.  History of Present Illness: now 11 days s/p thyroidectomy for multinodular goiter.  was found to have 10 mm papillary adenocarcinoma, follicular variant. takes synthroid 75/d takes rocaltrol 2x0.25 micrograms qd    Current Allergies: ! 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     Physical Exam  General:     well developed, well nourished, in no acute distress Neck:     no masses, thyromegaly, or abnormal cervical nodes.  a healing surgical scar is present Additional Exam:     outside test results are reviewed:  pathol report: multinodular goiter, but has 10 mm papillary adenocarcinoma, follicular variant, no extension  7/5/9: ca++=7.0    Impression & Recommendations:  Problem # 1:  THYROID CANCER (ICD-193)  Orders: Est. 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 Take 2 by mouth qd 2)  Levothyroxine Sodium 75 Mcg Tabs (Levothyroxine sodium) .... Take 1 by mouth qd 3)  [facility] Calcium  Carbonate/vit D 600-125 Mg-unit Tabs (Calcium carbonate-vitamin d) .... Take 2 qid   Patient Instructions: 1)  we discussed the low-risk nature of her thyroid cancer.  we discussed controversial nature of i-131 rx in this setting.  she wants to do so. 2)  therefore, d/c synthroid and do tsh in 7d 241.0 3)  also pth 275.41   ]

## 2010-09-19 ENCOUNTER — Other Ambulatory Visit: Payer: Self-pay | Admitting: Family Medicine

## 2010-09-19 ENCOUNTER — Other Ambulatory Visit (HOSPITAL_COMMUNITY)
Admission: RE | Admit: 2010-09-19 | Discharge: 2010-09-19 | Disposition: A | Discharge status: Home / Self Care | Payer: 59 | Source: Ambulatory Visit | Attending: Family Medicine | Admitting: Family Medicine

## 2010-09-19 DIAGNOSIS — Z124 Encounter for screening for malignant neoplasm of cervix: Secondary | ICD-10-CM | POA: Insufficient documentation

## 2010-09-29 ENCOUNTER — Telehealth: Payer: Self-pay | Admitting: Endocrinology

## 2010-10-07 NOTE — Progress Notes (Signed)
Summary: Pharmacy change  Phone Note Call from Patient Call back at Work Phone [phone]   Caller: Patient [phone] Summary of Call: Pt called requesting new Rx for Synthroid to [facility] due to Insurance change. Initial call taken by: Margaret Pyle, CMA,  September 29, 2010 11:08 AM    Prescriptions: SYNTHROID 112 MCG TABS (LEVOTHYROXINE SODIUM) 1 qd  #90 Tablet x 0   Entered by:   Margaret Pyle, CMA   Authorized by:   Minus Breeding MD   Signed by:   Margaret Pyle, CMA on 09/29/2010   Method used:   Faxed to ...       [facility]       [address] [place], [place]  [address]       Ph: [phone]       Fax: [phone]   RxID:   [id number]

## 2010-10-16 ENCOUNTER — Encounter: Payer: Self-pay | Admitting: Endocrinology

## 2010-10-28 NOTE — Letter (Signed)
Summary: [facility]   Imported By: Sherian Rein 10/23/2010 07:58:45  _____________________________________________________________________  External Attachment:    Type:   Image     Comment:   External Document

## 2010-12-11 ENCOUNTER — Encounter: Payer: Self-pay | Admitting: Endocrinology

## 2010-12-11 ENCOUNTER — Other Ambulatory Visit (INDEPENDENT_AMBULATORY_CARE_PROVIDER_SITE_OTHER): Payer: 59

## 2010-12-11 ENCOUNTER — Ambulatory Visit (INDEPENDENT_AMBULATORY_CARE_PROVIDER_SITE_OTHER): Payer: 59 | Admitting: Endocrinology

## 2010-12-11 DIAGNOSIS — C73 Malignant neoplasm of thyroid gland: Secondary | ICD-10-CM

## 2010-12-11 DIAGNOSIS — E890 Postprocedural hypothyroidism: Secondary | ICD-10-CM

## 2010-12-11 DIAGNOSIS — E209 Hypoparathyroidism, unspecified: Secondary | ICD-10-CM

## 2010-12-11 NOTE — Progress Notes (Signed)
  Subjective:    Patient ID: Vickie Valdez, female    DOB: 10-14-1955, 55 y.o.   MRN: [id number]  HPI the status of at least 3 ongoing medical problems is addressed today: papillary adenocarcinoma of the thyroid:  pt says she does not notice any nodule at the neck. hypoparathyroidism:  she does not take a ca++ tab, other than what is in her multivitamin.  Denies cramps.  postoperative hypothyroidism:  she takes synthroid as rx'ed.  Fatigue is less recently. Past Medical History  Diagnosis Date  . HYPOTHYROIDISM, POSTSURGICAL 02/20/2008  . 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 THYROID CANCER 02/20/2008    stage 1 papillary adenocarcinoma, follicular variant 06/09: thyroidectomy for 10mm papillary ca, follicular variant 08/09: 104 [facility] I-131 rx , post-therapy scan pos only at the neck 1/10: tg undectectable (ab neg) neg thyrogen scan 05/10: tg undetectable (ab neg) 11/10: tg undetectable (ab neg) 5/11: tg undectectable (ab neg)  . Other dysphagia 11/09/2007  . GERD 11/08/2007  . Multinodular goiter   stage 1 papillary adenocarcinoma, follicular variant 6/09: thyroidectomy for 10 mm papillary ca, follicular variant 8/09: 104 [facility] i-131 rx 8/09: post-therapy scan pos only at the neck 1/10:  tg undetectable (ab neg) 1/10: neg thyrogen scan 5/10: tg undetectable (ab neg) 11/10: tg undetectable (ab neg) 5/11:  tg undetectable (ab neg)  Past Surgical History  Procedure Date  . Biopsy thyroid 10/14/2007  . Thyroid ultrasound 08/12/2007    History   Social History  . Marital Status: Married    Spouse Name: N/A    Number of Children: N/A  . Years of Education: N/A   Occupational History  . Administrator      [employer]   Social History Main Topics  . Smoking status: Never Smoker   . Smokeless tobacco: Not on file  . Alcohol Use: No  . Drug Use: No  . Sexually Active:    Other Topics Concern  . Not on file   Social History Narrative   Regular exercise-yes    Current  Outpatient Prescriptions on File Prior to Visit  Medication Sig Dispense Refill  . clidinium-chlordiazePOXIDE (LIBRAX) 2.5-5 MG per capsule Take 1 capsule by mouth 4 (four) times daily as needed.        Marland Kitchen levothyroxine (SYNTHROID, LEVOTHROID) 112 MCG tablet Take 112 mcg by mouth daily.        . Multiple Vitamin (MULTIVITAMIN) tablet Take 1 tablet by mouth daily.        Marland Kitchen DISCONTD: dexlansoprazole (DEXILANT) 60 MG capsule Take 60 mg by mouth daily.          Allergies  Allergen Reactions  . Penicillins     Family History  Problem Relation Age of Onset  . Thyroid disease Sister     Hyperthyroidism    BP 128/72  Pulse 70  Temp(Src) 98.3 F (36.8 C) (Oral)  Ht 5\' 3"  (1.6 m)  Wt 193 lb 6.4 oz (87.726 kg)  BMI 34.26 kg/m2  SpO2 97%     Review of Systems Denies hoarseness and numbness.    Objective:   Physical Exam GENERAL: no distress Neck: a healed scar is present.  i do not appreciate a nodule in the thyroid or elsewhere in the neck.     Lab Results  Component Value Date   TSH 0.15* 12/11/2010   tg is undetectable   Assessment & Plan:  Papillary adenocarcinoma of the thyroid.  No evidence of recurrence. Post-surgical hypothyroidism.  Well-replaced Hypoparathyroidism, improved.

## 2010-12-11 NOTE — Patient Instructions (Addendum)
blood tests are being ordered for you today.  please call [phone] to hear your test results.  You will be prompted to enter the 9-digit "MRN" number that appears at the top left of this page, followed by #.  Then you will hear the message. pending the test results, please continue the same medications for now Please return in 1 year (update: i left message on phone-tree:  rx as we discussed).

## 2010-12-12 LAB — THYROGLOBULIN LEVEL
Thyroglobulin Ab: <20 U/mL (ref <40.0)
Thyroglobulin: <0.2 ng/mL (ref 0.0–55.0)

## 2010-12-12 LAB — PTH, INTACT AND CALCIUM
Calcium, Total (PTH): 8.9 mg/dL (ref 8.4–10.5)
PTH: 30.9 pg/mL (ref 14.0–72.0)

## 2010-12-12 LAB — TSH: TSH: 0.15 u[IU]/mL — ABNORMAL LOW (ref 0.35–5.50)

## 2010-12-12 LAB — ANTI-THYROGLOBULIN ANTIBODY: Thyroglobulin Ab: <20 U/mL (ref <40.0)

## 2010-12-23 NOTE — Op Note (Signed)
NAMEANAILY, ASHBAUGH          ACCOUNT NO.:  [id number]   MEDICAL RECORD NO.:  [id number]          PATIENT TYPE:  AMB   LOCATION:  DAY                          FACILITY:  [facility]   PHYSICIAN:  Leonie Man, M.D.   DATE OF BIRTH:  1956-01-23   DATE OF PROCEDURE:  02/09/2008  DATE OF DISCHARGE:                               OPERATIVE REPORT   PREOPERATIVE DIAGNOSIS:  Multinodular goiter with pressure symptoms.   POSTOPERATIVE DIAGNOSIS:  Multinodular goiter with pressure symptoms.   PROCEDURE:  Total thyroidectomy.   SURGEON:  Leonie Man, M.D.   ASSISTANT:  Angelia Mould. Derrell Lolling, M.D.   ANESTHESIA:  General.   SPECIMENS TO LAB:  Thyroid gland.   ESTIMATED BLOOD LOSS:  Minimal.   COMPLICATIONS:  None.   DISPOSITION:  The patient returned to the PACU in excellent condition.   Ms. Malvina Schadler is a 55 year old female presenting with pressure  symptoms in her neck, causing difficulty swallowing and some hoarseness.  She underwent evaluation under the care of Dr. Dorisann Frames and on  ultrasound was shown to have an abnormally large thyroid gland.  A  subsuquent aspiration biopsy of the large nodule in the thyroid,  showed  follicular lesion scant colloid making the possibility of a follicular  carcinoma, possible.  The patient comes to the operating room now for  total thyroidectomy, for relief of symptoms and to rule out carcinoma.   PROCEDURE IN DETAIL:  The patient is positioned supinely and following  the induction of satisfactory general anesthesia, the neck is prepped  and draped to be included in the sterile operative field.  Positive  identification of the patient as Vickie Valdez, and the procedure to  be performed as total thyroidectomy was carried out.   A transverse collar incision was carried out from the anterior border of  the sternocleidomastoid muscles in a the neck crease, deepened through  the skin and subcutaneous tissues and across the  platysma muscle.  A  superior myocutaneousior flap raised to the thyroid cartilage, and an  inferior flap carried down to the sternal notch.  The strap muscles  opened in the midline and the dissection carried over onto the right  side of the neck.  The thyroid was mobilized from the neck and the  dissection carried up to the upper poleof the gland.  The upper pole  vessels were isolated and secured with ties of 2-0 silk and clips.  The  right upper and right lower parathyroid glands were positively  identified and spared.  The right recurrent laryngeal nerve was also  positively identified and protected  throughout the course of the  dissection.  The dissection was carried to the trachea, taking the  thyroid out and dissecting it across the midline of the trachea.  There  was not a significant pyramidal lobe.   The dissection was then taken on to the left side of the neck, where  again the left upper pole vessels were isolated, doubly tied and  clipped.  Both the left upper and left lower parathyroid glands, as well  as the left recurrent laryngeal nerve were positively identified  and  spared.  The thyroid was then dissected free from off of the trachea and  removed it and forwarded it for pathologic evaluation.  The right upper  pole was marked with suture for future identification.  Sponge and  instrument counts were verified.  Both sides of the neck were then  thoroughly irrigated with normal saline.  Additional bleeding points  were treated with electrocautery.  Surgicel, gauze and pads were placed  over all the areas of dissection.  The neck incision was then closed in  layers as follows:  The strap muscles and the midline with interrupted  sutures of 3-0 Vicryl, platysma muscle with interrupted sutures of 3-0  Vicryl.  Skin was closed with a running subcuticular suture of 5-0  Monocryl, and then reinforced with Dermabond and Steri-Strips.  The  patient was then removed from the  operating room to the recovery room in  stable condition.  She tolerated the procedure well.      Leonie Man, M.D.  Electronically Signed     PB/MEDQ  D:  02/09/2008  T:  02/09/2008  Job:  [id number]

## 2011-01-26 ENCOUNTER — Other Ambulatory Visit: Payer: Self-pay | Admitting: *Deleted

## 2011-01-26 MED ORDER — LEVOTHYROXINE SODIUM 112 MCG PO TABS: 112.0000 ug | ORAL_TABLET | Freq: Every day | ORAL | Status: DC

## 2011-01-26 NOTE — Telephone Encounter (Signed)
R'cd fax from [facility] for refill of Synthroid  Last OV-12/10/2009  Last Filled-09/29/2010

## 2011-01-27 ENCOUNTER — Ambulatory Visit: Payer: 59 | Admitting: *Deleted

## 2011-01-27 ENCOUNTER — Encounter: Payer: 59 | Attending: Gastroenterology | Admitting: *Deleted

## 2011-01-27 DIAGNOSIS — Z713 Dietary counseling and surveillance: Secondary | ICD-10-CM | POA: Insufficient documentation

## 2011-01-27 DIAGNOSIS — K3189 Other diseases of stomach and duodenum: Secondary | ICD-10-CM | POA: Insufficient documentation

## 2011-02-10 ENCOUNTER — Encounter: Payer: 59 | Attending: Gastroenterology | Admitting: *Deleted

## 2011-02-10 ENCOUNTER — Ambulatory Visit: Payer: 59 | Admitting: *Deleted

## 2011-02-10 DIAGNOSIS — K3189 Other diseases of stomach and duodenum: Secondary | ICD-10-CM | POA: Insufficient documentation

## 2011-02-10 DIAGNOSIS — Z713 Dietary counseling and surveillance: Secondary | ICD-10-CM | POA: Insufficient documentation

## 2011-02-10 DIAGNOSIS — R1013 Epigastric pain: Secondary | ICD-10-CM | POA: Insufficient documentation

## 2011-03-12 ENCOUNTER — Ambulatory Visit: Payer: 59 | Admitting: *Deleted

## 2011-03-13 ENCOUNTER — Ambulatory Visit: Payer: 59 | Admitting: *Deleted

## 2011-05-07 LAB — DIFFERENTIAL
Basophils Absolute: 0.1
Basophils Relative: 2 — ABNORMAL HIGH
Eosinophils Absolute: 0.1
Eosinophils Relative: 2
Lymphocytes Relative: 32
Lymphs Abs: 1.7
Monocytes Absolute: 0.3
Monocytes Relative: 6
Neutro Abs: 3.1
Neutrophils Relative %: 59

## 2011-05-07 LAB — CBC
HCT: 37.9
Hemoglobin: 12.5
MCHC: 33
MCV: 82.9
Platelets: 272
RBC: 4.57
RDW: 13
WBC: 5.2

## 2011-05-07 LAB — COMPREHENSIVE METABOLIC PANEL
ALT: 18
AST: 24
Albumin: 3.5
Alkaline Phosphatase: 57
BUN: 9
CO2: 27
Calcium: 9.4
Chloride: 110
Creatinine, Ser: 0.8
GFR calc Af Amer: >60
GFR calc non Af Amer: >60
Glucose, Bld: 91
Potassium: 4.5
Sodium: 143
Total Bilirubin: 1
Total Protein: 6.8

## 2011-05-07 LAB — PROTIME-INR
INR: 1.1
Prothrombin Time: 14

## 2011-05-07 LAB — CALCIUM
Calcium: 7 — ABNORMAL LOW
Calcium: 7.3 — ABNORMAL LOW
Calcium: 7.9 — ABNORMAL LOW

## 2011-05-07 LAB — PREGNANCY, URINE: Preg Test, Ur: NEGATIVE

## 2011-05-08 LAB — HCG, SERUM, QUALITATIVE: Preg, Serum: NEGATIVE

## 2011-07-27 ENCOUNTER — Ambulatory Visit: Payer: 59

## 2011-07-27 ENCOUNTER — Encounter: Payer: Self-pay | Admitting: Endocrinology

## 2011-07-27 ENCOUNTER — Ambulatory Visit (INDEPENDENT_AMBULATORY_CARE_PROVIDER_SITE_OTHER): Payer: 59 | Admitting: Endocrinology

## 2011-07-27 VITALS — BP 122/76 | HR 68 | Temp 98.8°F | Ht 63.0 in | Wt 194.4 lb

## 2011-07-27 DIAGNOSIS — M79609 Pain in unspecified limb: Secondary | ICD-10-CM

## 2011-07-27 DIAGNOSIS — E890 Postprocedural hypothyroidism: Secondary | ICD-10-CM

## 2011-07-27 DIAGNOSIS — C73 Malignant neoplasm of thyroid gland: Secondary | ICD-10-CM

## 2011-07-27 DIAGNOSIS — M79603 Pain in arm, unspecified: Secondary | ICD-10-CM

## 2011-07-27 DIAGNOSIS — E209 Hypoparathyroidism, unspecified: Secondary | ICD-10-CM

## 2011-07-27 LAB — TSH: TSH: 0.38 u[IU]/mL (ref 0.35–5.50)

## 2011-07-27 NOTE — Patient Instructions (Addendum)
blood tests are being requested for you today.  please call [phone] to hear your test results.  You will be prompted to enter the 9-digit "MRN" number that appears at the top left of this page, followed by #.  Then you will hear the message. I hope you feel better soon.  If you don't feel better by next week, please call dr Clelia Croft.   Please come back for a follow-up appointment in 6 months. (update: i left message on phone-tree:  rx as we discussed)

## 2011-07-27 NOTE — Progress Notes (Signed)
Subjective:    Patient ID: Vickie Valdez, female    DOB: [birth year], 55 y.o.   MRN: [id number]  HPI Pt states 10 days of slight pain at both arms, not in the context of exertion or movement of the arms.  and assoc numbness.         THYROID CANCER 02/20/2008   stage 1 papillary adenocarcinoma, follicular variant  06/09: thyroidectomy for 10mm papillary ca, follicular variant  08/09: 104 [facility] I-131 rx , post-therapy scan pos only at the neck  1/10: tg undectectable (ab neg) neg thyrogen scan  05/10: tg undetectable (ab neg)  11/10: tg undetectable (ab neg)  5/11: tg undectectable (ab neg) 5/12 tg undetectable (ab neg)   Past Medical History  Diagnosis Date  . HYPOTHYROIDISM, POSTSURGICAL 02/20/2008  . 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 THYROID CANCER 02/20/2008    stage 1 papillary adenocarcinoma, follicular variant 06/09: thyroidectomy for 10mm papillary ca, follicular variant 08/09: 104 [facility]ci I-131 rx , post-therapy scan pos only at the neck 1/10: tg undectectable (ab neg) neg thyrogen scan 05/10: tg undetectable (ab neg) 11/10: tg undetectable (ab neg) 5/11: tg undectectable (ab neg)  . Other dysphagia 11/09/2007  . GERD 11/08/2007  . Multinodular goiter     Past Surgical History  Procedure Date  . Biopsy thyroid 10/14/2007  . Thyroid ultrasound 08/12/2007    History   Social History  . Marital Status: Married    Spouse Name: N/A    Number of Children: N/A  . 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 Smoking status: Never Smoker   . Smokeless tobacco: Not on file  . Alcohol Use: No  . Drug Use: No  . Sexually Active:    Other Topics Concern  . Not on file   Social History Narrative   Regular exercise-yes    Current Outpatient Prescriptions on File Prior to Visit  Medication Sig Dispense Refill  . clidinium-chlordiazePOXIDE (LIBRAX) 2.5-5 MG per capsule Take 1 capsule by mouth 4  (four) times daily as needed.        Marland Kitchen levothyroxine (SYNTHROID, LEVOTHROID) 112 MCG tablet Take 1 tablet (112 mcg total) by mouth daily.  90 tablet  2  . Multiple Vitamin (MULTIVITAMIN) tablet Take 1 tablet by mouth daily.        Marland Kitchen omeprazole (PRILOSEC OTC) 20 MG tablet Take 20 mg by mouth daily.          Allergies  Allergen Reactions  . Penicillins     Family History  Problem Relation Age of Onset  . Thyroid disease Sister     Hyperthyroidism    BP 122/76  Pulse 68  Temp(Src) 98.8 F (37.1 C) (Oral)  Ht 5\' 3"  (1.6 m)  Wt 194 lb 6.4 oz (88.179 kg)  BMI 34.44 kg/m2  SpO2 95%   Review of Systems She denies any nodule at the neck.  She has weight gain.  No chest pain.      Objective:   Physical Exam VITAL SIGNS:  See vs page GENERAL: no distress Neck: a healed scar is present.  i do not appreciate a nodule in the thyroid or elsewhere in the neck Msk:  Full rom of the shoulders, and of the joints of the ue's, without pain. Skin: no rash of the ue's.    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 Postsurgical hypothyroidism.  In view of variable tsh, i favor continuing the same synthroid.

## 2011-07-28 LAB — PTH, INTACT AND CALCIUM
Calcium, Total (PTH): 8.8 mg/dL (ref 8.4–10.5)
PTH: 34.9 pg/mL (ref 14.0–72.0)

## 2011-07-28 LAB — THYROGLOBULIN LEVEL: Thyroglobulin: <0.2 ng/mL (ref 0.0–55.0)

## 2011-07-29 LAB — THYROGLOBULIN ANTIBODY: Thyroglobulin Ab: <20 U/mL (ref <40.0)

## 2011-07-31 ENCOUNTER — Telehealth: Payer: Self-pay

## 2011-07-31 NOTE — Telephone Encounter (Signed)
Pt called requesting result of PTH and Thyroglobulin Ab, please advise.

## 2011-07-31 NOTE — Telephone Encounter (Signed)
Pt advised.

## 2011-09-28 ENCOUNTER — Other Ambulatory Visit: Payer: Self-pay | Admitting: Endocrinology

## 2011-10-16 ENCOUNTER — Other Ambulatory Visit: Payer: Self-pay | Admitting: Family Medicine

## 2011-10-16 ENCOUNTER — Other Ambulatory Visit (HOSPITAL_COMMUNITY)
Admission: RE | Admit: 2011-10-16 | Discharge: 2011-10-16 | Disposition: A | Discharge status: Home / Self Care | Payer: 59 | Source: Ambulatory Visit | Attending: Family Medicine | Admitting: Family Medicine

## 2011-10-16 DIAGNOSIS — Z124 Encounter for screening for malignant neoplasm of cervix: Secondary | ICD-10-CM | POA: Insufficient documentation

## 2011-12-11 ENCOUNTER — Other Ambulatory Visit (INDEPENDENT_AMBULATORY_CARE_PROVIDER_SITE_OTHER): Payer: 59

## 2011-12-11 ENCOUNTER — Encounter: Payer: Self-pay | Admitting: Endocrinology

## 2011-12-11 ENCOUNTER — Ambulatory Visit (INDEPENDENT_AMBULATORY_CARE_PROVIDER_SITE_OTHER): Payer: 59 | Admitting: Endocrinology

## 2011-12-11 ENCOUNTER — Telehealth: Payer: Self-pay | Admitting: *Deleted

## 2011-12-11 VITALS — BP 128/92 | HR 83 | Temp 98.3°F | Ht 64.0 in | Wt 186.0 lb

## 2011-12-11 DIAGNOSIS — E890 Postprocedural hypothyroidism: Secondary | ICD-10-CM

## 2011-12-11 DIAGNOSIS — C73 Malignant neoplasm of thyroid gland: Secondary | ICD-10-CM

## 2011-12-11 LAB — TSH: TSH: 0.09 u[IU]/mL — ABNORMAL LOW (ref 0.35–5.50)

## 2011-12-11 NOTE — Progress Notes (Signed)
  Subjective:    Patient ID: Vickie Valdez, female    DOB: 30-Aug-1955, 56 y.o.   MRN: [id number]  HPI stage-1 papillary adenocarcinoma of the thyroid:  pt says she does not notice any nodule at the neck. postoperative hypothyroidism:  she takes synthroid as rx'ed.  Denies fatigue.  She was recently started on buspar, to treat symptoms related to the recent death of her mother. Past Medical History  Diagnosis Date  . HYPOTHYROIDISM, POSTSURGICAL 02/20/2008  . 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 THYROID CANCER 02/20/2008    stage 1 papillary adenocarcinoma, follicular variant 06/09: thyroidectomy for 10mm papillary ca, follicular variant 08/09: 104 [facility] I-131 rx , post-therapy scan pos only at the neck 1/10: tg undectectable (ab neg) neg thyrogen scan 05/10: tg undetectable (ab neg) 11/10: tg undetectable (ab neg) 5/11: tg undectectable (ab neg)  . Other dysphagia 11/09/2007  . GERD 11/08/2007  . Multinodular goiter   stage 1 papillary adenocarcinoma, follicular variant 6/09: thyroidectomy for 10 mm papillary ca, follicular variant 8/09: 104 mci i-131 rx 8/09: post-therapy scan pos only at the neck 1/10:  tg undetectable (ab neg) 1/10: neg thyrogen scan 5/10: tg undetectable (ab neg) 11/10: tg undetectable (ab neg) 5/11:  tg undetectable (ab neg)  Past Surgical History  Procedure Date  . Biopsy thyroid 10/14/2007  . Thyroid ultrasound 08/12/2007    History   Social History  . Marital Status: Married    Spouse Name: N/A    Number of Children: N/A  . 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 Smoking status: Never Smoker   . Smokeless tobacco: Not on file  . Alcohol Use: No  . Drug Use: No  . Sexually Active:    Other Topics Concern  . Not on file   Social History Narrative   Regular exercise-yes    Current Outpatient Prescriptions on File Prior to Visit  Medication Sig Dispense Refill  .  clidinium-chlordiazePOXIDE (LIBRAX) 2.5-5 MG per capsule Take 1 capsule by mouth 4 (four) times daily as needed.        . Multiple Vitamin (MULTIVITAMIN) tablet Take 1 tablet by mouth daily.        Marland Kitchen omeprazole (PRILOSEC) 20 MG capsule Take 20 mg by mouth daily.       Marland Kitchen SYNTHROID 112 MCG tablet TAKE 1 TABLET DAILY  90 tablet  2    Allergies  Allergen Reactions  . Penicillins     Family History  Problem Relation Age of Onset  . Thyroid disease Sister     Hyperthyroidism    BP 128/92  Pulse 83  Temp(Src) 98.3 F (36.8 C) (Oral)  Ht 5\' 4"  (1.626 m)  Wt 186 lb (84.369 kg)  BMI 31.93 kg/m2  SpO2 99%  Review of Systems Denies hoarseness and numbness.    Objective:   Physical Exam VITAL SIGNS:  See vs page GENERAL: no distress Neck: a healed scar is present.  i do not appreciate a nodule in the thyroid or elsewhere in the neck.     Thyroglobulin and anti-thyroglobulin antibody are undetectable Lab Results  Component Value Date   TSH 0.09* 12/11/2011   Assessment & Plan:  Papillary adenocarcinoma of the thyroid.  No evidence of recurrence. Post-surgical hypothyroidism.  Well-replaced in the setting of thyroid cancer

## 2011-12-11 NOTE — Telephone Encounter (Signed)
Called pt to inform of TSH results, pt informed (letter also mailed to pt).

## 2011-12-11 NOTE — Patient Instructions (Signed)
blood tests are being requested for you today.  You will receive a letter with results. Please return in 1 year. 

## 2011-12-14 LAB — THYROGLOBULIN LEVEL: Thyroglobulin: <0.2 ng/mL (ref 0.0–55.0)

## 2011-12-15 ENCOUNTER — Encounter: Payer: Self-pay | Admitting: Endocrinology

## 2011-12-15 LAB — THYROGLOBULIN ANTIBODY: Thyroglobulin Ab: <20 U/mL (ref <40.0)

## 2011-12-16 ENCOUNTER — Telehealth: Payer: Self-pay | Admitting: *Deleted

## 2011-12-16 NOTE — Telephone Encounter (Signed)
Called pt to inform of thyroid lab results. Pt informed (letter also mailed to pt).

## 2011-12-30 ENCOUNTER — Telehealth: Payer: Self-pay | Admitting: Endocrinology

## 2011-12-30 NOTE — Telephone Encounter (Signed)
The pt called and is hoping to be worked in for tingling in her arm.  Do you want her added to your schedule today? She did not report any other symptoms.    Thanks!   [phone]

## 2011-12-30 NOTE — Telephone Encounter (Signed)
Pt informed of MD's advisement. 

## 2011-12-30 NOTE — Telephone Encounter (Signed)
Left message for pt to callback office.  

## 2011-12-30 NOTE — Telephone Encounter (Signed)
This problem should be addressed by pt's pcp

## 2012-06-03 ENCOUNTER — Other Ambulatory Visit: Payer: Self-pay | Admitting: Endocrinology

## 2012-08-24 ENCOUNTER — Encounter: Payer: 59 | Attending: Family Medicine | Admitting: Dietician

## 2012-08-24 VITALS — Ht 64.0 in | Wt 187.1 lb

## 2012-08-24 DIAGNOSIS — K3189 Other diseases of stomach and duodenum: Secondary | ICD-10-CM

## 2012-08-24 DIAGNOSIS — Z713 Dietary counseling and surveillance: Secondary | ICD-10-CM | POA: Insufficient documentation

## 2012-08-24 NOTE — Progress Notes (Signed)
Medical Nutrition Therapy:  Appt start time: 1500 end time:  1600.   Assessment:  Primary concerns today: Assistance with weight loss after thyroid surgery. Having problems with "felling that food is not digesting."  Issues are greater at night.  Depends on how late she is eating.  If not eating late, does not have the problem.  Currently working full time and describes her job as stressful.   She also has the responsibility of having to help other family member who have a number of health issues.  She verbalizes "a decision to begin to take care of herself."  She has spent time noting the effects of various foods on her digestion and relates "how things seem to work the best."  Her indigestion is increased if she eats aft 6 PM.  At that time it feels like food is not going all the way through.  At times it feels stuck" in what she gestures as the mid-sternal area.   Has taken frozen foods to work and when leaving work later, will go ahead and have a frozen entree as she finishes work and then have only a snack at home.   Since her mother's death earlier last year, she is not eating meat, poultry, or fish proteins.  MEDICATIONS: Completed review of medications.   DIETARY INTAKE:  24-hr recall:  B ( AM): Oatmeal  1 cup with some small amount of almond milk along with a little amount of banana and cinnamon and pack of equal.  OR apple Or Spinach mcMuffin.  C/o of a lack of appetite.  Water or Sparkling water   Snk ( AM): water  L ( PM): Mid-day maybe a piece of fruit Snk ( PM): 2:00 egg roll (veggie) with pea/carrots sometimes a salad (asiann salad with carrots), Carrying from home. Water with flavoring D ( PM):6:00 Black bean burger with 1 slice of bread and sometimes none, maybe ketsup and sometimes a serving of fries or chips. Snk ( PM): cheese nips (baked), and water. Beverages: Water, sparkling water, almond milk  Usual physical activity: Walking about 2 days per week.    Estimated energy  needs: HT: 64 in  WT: 187.1 lb  BMI: 32.2 Kg/m2  Adj: WT: 140-145 lb (66 Kg) 1400 calories 155-160 g carbohydrates 100-105 g protein 36-39 g fat  Progress Towards Goal(s):  In progress.   Nutritional Diagnosis:  [place]-1.4 Altered GI function As related to impaired gastric motility.  As evidenced by periods of increased gas, bloating, feelings of pain and fullness following a large or fatty or high protein meal..    Intervention:  Nutrition Recommended continuing to limit the fatty and fried foods.  Aim for using the leaner or low fat products.  Plan to have the grilled, baked, broiled foods.  Plan to have smaller servings.  Continue to monitor responses to foods and correlate that with your amounts and eating patterns and times.  Continue to limit the spicy foods, alcohol, foods containing peppermint.  Consider getting a foam block for elevating your head at night.  When and if you start back with meats/fish protein, consider the softer fish rather than the red meats.     Salmon patties, early evening or lunch and use non-stick pan and little oil or a spray.  Keep the pinto and black beans in your diet, and rinse before warming.  If having problems with cheese and digestion consider the 2% cheeses.  If not doing eggs, consider using the egg beater.  Continue the soups.   Avoid mint or peppermint in the evening hours.  Consider using a foam block to elevate head and body trunk  Special K High Protein cerea; cam be used with Almond Milk.  Try to keep the sugar on the food label for cereals, beverages, snacks at (0-9 gm).  Cooked fruit has less acid and fiber that the raw fruit.  Keep portions smaller. Handouts given during visit include:  After visit summary  Monitoring/Evaluation:  Dietary intake, exercise, and body weight in 8-12 weeks and call or e-mail in the meantime with questions or concerns.Marland Kitchen

## 2012-08-24 NOTE — Patient Instructions (Addendum)
   Try to have bigger meal at mid-day  Try to have the dinner no later 6:00-6:30 PM  Consider at 4:00-5:00 PM, have an entree or your dinner.  Later or evening meal, try to keep low fat.  Using the Lite/low fat food items.  Fish (bake,  broil, grill) use the white fish (talapia, whiting, flounder)  Using the lite, especially the [place] yogurt.  Less spicy foods at night.  Consider using a soy protein,/tofu on occasion.  Salmon patties, early evening or lunch and use non-stick pan and little oil or a spray.  Keep the pinto and black beans in your diet, and rinse before warming.  I having problems, consider the 2% cheeses.  If not doing eggs, consider using the egg beater.  Continue the soups.   Avoid mint or peppermint in the evening hours.  Consider using a foam block to elevate head and body trunk  Special K High Protein cerea; cam be used with Almond Milk.  Try to keep the sugar on the food label for cereals, beverages, snacks at (0-9 gm).  Cooked fruit has less acid and fiber that the raw fruit.  Keep portions smaller.

## 2012-08-28 ENCOUNTER — Encounter: Payer: Self-pay | Admitting: Dietician

## 2012-12-16 ENCOUNTER — Telehealth: Payer: Self-pay

## 2012-12-16 ENCOUNTER — Encounter: Payer: Self-pay | Admitting: Endocrinology

## 2012-12-16 ENCOUNTER — Ambulatory Visit (INDEPENDENT_AMBULATORY_CARE_PROVIDER_SITE_OTHER): Payer: 59 | Admitting: Endocrinology

## 2012-12-16 VITALS — BP 120/74 | HR 80 | Ht 63.0 in | Wt 190.0 lb

## 2012-12-16 DIAGNOSIS — E890 Postprocedural hypothyroidism: Secondary | ICD-10-CM

## 2012-12-16 DIAGNOSIS — C73 Malignant neoplasm of thyroid gland: Secondary | ICD-10-CM

## 2012-12-16 DIAGNOSIS — E209 Hypoparathyroidism, unspecified: Secondary | ICD-10-CM

## 2012-12-16 LAB — TSH: TSH: 0.639 u[IU]/mL (ref 0.350–4.500)

## 2012-12-16 NOTE — Telephone Encounter (Signed)
Pt called and would like to know the date of the 5th year when she was diagnosed as cancer free, please advise [phone]

## 2012-12-16 NOTE — Telephone Encounter (Signed)
Yes, surg was on 02/09/08

## 2012-12-16 NOTE — Patient Instructions (Addendum)
blood tests are being requested for you today.  We'll contact you with results.   Please return in 1 year.   

## 2012-12-16 NOTE — Progress Notes (Signed)
  Subjective:    Patient ID: Vickie Valdez, female    DOB: 02-22-1956, 57 y.o.   MRN: [id number]  HPI The state of at least three ongoing medical problems is addressed today, with interval history of each noted here: stage-1 papillary adenocarcinoma of the thyroid:  pt says she does not notice any nodule at the neck.  stage 1 papillary adenocarcinoma, follicular variant 6/09: thyroidectomy for 10 mm papillary ca, follicular variant (T1 N0 M0) 8/09: 104 [facility]ci i-131 rx 8/09: post-therapy scan pos only at the neck.   1/10:  tg undetectable (ab neg) 1/10: neg thyrogen scan 5/10: tg undetectable (ab neg) 11/10: tg undetectable (ab neg) 5/11:  tg undetectable (ab neg) 5/13: tg undetectable (ab neg) postsurgica hypothyroidism:  she takes synthroid as rx'ed.  Denies fatigue.   Postsurgical hypoparathyroidism: she has not required rx.  she denies cramps.    Review of Systems Denies weight gain and numbness    Objective:   Physical Exam VITAL SIGNS:  See vs page GENERAL: no distress Neck: a healed scar is present.  i do not appreciate a nodule in the thyroid or elsewhere in the neck.       Assessment & Plan:  Stage-1 differentiated thyroid cancer.  She has little chance of recurrence Postsurgical hypothyroidism: Given her low stage and long disease-free interval, she does not need a suppressed TSH.   Postsurgical hypoparathyroidism, mild.

## 2012-12-16 NOTE — Telephone Encounter (Signed)
[phone]

## 2012-12-16 NOTE — Telephone Encounter (Signed)
Pt advised.

## 2012-12-19 ENCOUNTER — Telehealth: Payer: Self-pay | Admitting: Endocrinology

## 2012-12-19 LAB — THYROGLOBULIN ANTIBODY: Thyroglobulin Ab: <20 U/mL (ref <40.0)

## 2012-12-19 LAB — PTH, INTACT AND CALCIUM
Calcium, Total (PTH): 9.1 mg/dL (ref 8.4–10.5)
PTH: 35.3 pg/mL (ref 14.0–72.0)

## 2012-12-19 LAB — THYROGLOBULIN LEVEL: Thyroglobulin: <0.2 ng/mL (ref 0.0–55.0)

## 2012-12-19 NOTE — Telephone Encounter (Signed)
Please contact patient when results are available.

## 2013-03-07 ENCOUNTER — Other Ambulatory Visit: Payer: Self-pay | Admitting: Endocrinology

## 2013-06-08 ENCOUNTER — Encounter: Payer: Self-pay | Admitting: Podiatry

## 2013-06-08 ENCOUNTER — Ambulatory Visit (INDEPENDENT_AMBULATORY_CARE_PROVIDER_SITE_OTHER): Payer: 59 | Admitting: Podiatry

## 2013-06-08 VITALS — BP 111/71 | HR 72 | Resp 16 | Ht 63.0 in | Wt 186.0 lb

## 2013-06-08 DIAGNOSIS — M722 Plantar fascial fibromatosis: Secondary | ICD-10-CM

## 2013-06-08 MED ORDER — TRIAMCINOLONE ACETONIDE 10 MG/ML IJ SUSP
5.0000 mg | Freq: Once | INTRAMUSCULAR | Status: AC
  Administered 2013-06-08: 5 mg via INTRA_ARTICULAR

## 2013-06-09 ENCOUNTER — Telehealth: Payer: Self-pay | Admitting: Endocrinology

## 2013-06-09 NOTE — Telephone Encounter (Signed)
Please call pt regarding concerns about BP / Vickie S.

## 2013-06-09 NOTE — Telephone Encounter (Signed)
Pt called regarding her BP. Returned call. Pt stated she went to the foot dr yesterday and it was 117/71. Should she be concerned with this number? Advised pt this is still within normal range and all is well.

## 2013-06-11 NOTE — Progress Notes (Signed)
Subjective:     Patient ID: Vickie Valdez, female   DOB: 1956-01-31, 57 y.o.   MRN: [id number]  Foot Pain   patient is found to have heel pain still present left with inflammation and fluid around the medial pain   Review of Systems     Objective:   Physical Exam  Nursing note and vitals reviewed. Constitutional: She is oriented to person, place, and time.  Cardiovascular: Intact distal pulses.   Musculoskeletal: Normal range of motion.  Neurological: She is oriented to person, place, and time.   pain in the heel at palpation to the tubercle within the plantar fashion     Assessment:     Plantar fasciitis with recurrent heel    Plan:     Advised on physical therapy continued orthotic usage and not going barefoot. Injected the plantar fascia 3 mg Kenalog 5 mg Xylocaine Marcaine mixture

## 2013-07-27 ENCOUNTER — Other Ambulatory Visit: Payer: Self-pay

## 2013-07-27 MED ORDER — LEVOTHYROXINE SODIUM 112 MCG PO TABS: ORAL_TABLET | ORAL | Status: DC

## 2013-08-09 ENCOUNTER — Ambulatory Visit: Payer: 59 | Admitting: Podiatry

## 2013-09-11 ENCOUNTER — Other Ambulatory Visit: Payer: Self-pay | Admitting: Family Medicine

## 2013-09-11 ENCOUNTER — Ambulatory Visit
Admission: RE | Admit: 2013-09-11 | Discharge: 2013-09-11 | Disposition: A | Discharge status: Home / Self Care | Payer: 59 | Source: Ambulatory Visit | Attending: Family Medicine | Admitting: Family Medicine

## 2013-09-11 DIAGNOSIS — M545 Low back pain, unspecified: Secondary | ICD-10-CM

## 2013-11-17 ENCOUNTER — Other Ambulatory Visit (HOSPITAL_COMMUNITY)
Admission: RE | Admit: 2013-11-17 | Discharge: 2013-11-17 | Disposition: A | Discharge status: Home / Self Care | Payer: 59 | Source: Ambulatory Visit | Attending: Family Medicine | Admitting: Family Medicine

## 2013-11-17 ENCOUNTER — Other Ambulatory Visit: Payer: Self-pay | Admitting: Family Medicine

## 2013-11-17 DIAGNOSIS — Z124 Encounter for screening for malignant neoplasm of cervix: Secondary | ICD-10-CM | POA: Insufficient documentation

## 2013-12-15 ENCOUNTER — Encounter: Payer: Self-pay | Admitting: Endocrinology

## 2013-12-15 ENCOUNTER — Ambulatory Visit (INDEPENDENT_AMBULATORY_CARE_PROVIDER_SITE_OTHER): Payer: 59 | Admitting: Endocrinology

## 2013-12-15 VITALS — BP 120/84 | HR 68 | Temp 97.8°F | Ht 63.0 in | Wt 177.0 lb

## 2013-12-15 DIAGNOSIS — E890 Postprocedural hypothyroidism: Secondary | ICD-10-CM

## 2013-12-15 DIAGNOSIS — E209 Hypoparathyroidism, unspecified: Secondary | ICD-10-CM

## 2013-12-15 DIAGNOSIS — C73 Malignant neoplasm of thyroid gland: Secondary | ICD-10-CM

## 2013-12-15 LAB — TSH: TSH: 0.07 u[IU]/mL — ABNORMAL LOW (ref 0.35–4.50)

## 2013-12-15 MED ORDER — LEVOTHYROXINE SODIUM 100 MCG PO TABS: 100.0000 ug | ORAL_TABLET | Freq: Every day | ORAL | Status: DC

## 2013-12-15 NOTE — Progress Notes (Signed)
Subjective:    Patient ID: Vickie Valdez, female    DOB: Feb 23, 1956, 58 y.o.   MRN: [id number]  HPI The state of at least three ongoing medical problems is addressed today, with interval history of each noted here: stage-1 papillary adenocarcinoma of the thyroid:  pt says she does not notice any nodule at the neck.  stage 1 papillary adenocarcinoma, follicular variant 7/82: thyroidectomy for 10 mm papillary ca, follicular variant (T1 N0 M0). 8/09: 104 [facility]ci i-131 rx 8/09: post-therapy scan pos only at the neck.   1/10:  tg undetectable (ab neg) 1/10: neg thyrogen scan 5/10: tg undetectable (ab neg) 11/10: tg undetectable (ab neg) 5/11:  tg undetectable (ab neg) 5/13: tg undetectable (ab neg) 5/14 :tg undetectable (ab neg) postsurgical hypothyroidism:  she takes synthroid as rx'ed.  Denies fatigue.   Postsurgical hypoparathyroidism: she has not required rx.  she denies cramps.  Past Medical History  Diagnosis Date  . HYPOTHYROIDISM, POSTSURGICAL 02/20/2008  . 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 THYROID CANCER 02/20/2008    stage 1 papillary adenocarcinoma, follicular variant 95/62: thyroidectomy for 63mm papillary ca, follicular variant 13/08: 104 [facility]ci I-131 rx , post-therapy scan pos only at the neck 1/10: tg undectectable (ab neg) neg thyrogen scan 05/10: tg undetectable (ab neg) 11/10: tg undetectable (ab neg) 5/11: tg undectectable (ab neg)  . Other dysphagia 11/09/2007  . GERD 11/08/2007  . Multinodular goiter     Past Surgical History  Procedure Laterality Date  . Biopsy thyroid  10/14/2007  . Thyroid ultrasound  08/12/2007  . Thyroidecctomy      History   Social History  . Marital Status: Married    Spouse Name: N/A    Number of Children: N/A  . Years of Education: N/A   Occupational History  . Administrator      [employer]   Social History Main Topics  . Smoking status: Never Smoker   . Smokeless tobacco: Not on file  . Alcohol Use: No  . Drug Use: No    . Sexual Activity:    Other Topics Concern  . Not on file   Social History Narrative   Regular exercise-yes    Current Outpatient Prescriptions on File Prior to Visit  Medication Sig Dispense Refill  . busPIRone (BUSPAR) 7.5 MG tablet Take 7.5 mg by mouth at bedtime.       . clidinium-chlordiazePOXIDE (LIBRAX) 2.5-5 MG per capsule Take 1 capsule by mouth 4 (four) times daily as needed.        . Multiple Vitamin (MULTIVITAMIN) tablet Take 1 tablet by mouth daily.        Marland Kitchen omeprazole (PRILOSEC) 20 MG capsule Take 20 mg by mouth daily.       . Probiotic Product (ALIGN) 4 MG CAPS Take 4 mg by mouth daily.       No current facility-administered medications on file prior to visit.    Allergies  Allergen Reactions  . Penicillins     Family History  Problem Relation Age of Onset  . Thyroid disease Sister     Hyperthyroidism    BP 120/84  Pulse 68  Temp(Src) 97.8 F (36.6 C) (Oral)  Ht 5\' 3"  (1.6 m)  Wt 177 lb (80.287 kg)  BMI 31.36 kg/m2  SpO2 97%  Review of Systems She has lost a few lbs.  She has mild anxiety.      Objective:   Physical Exam VITAL SIGNS:  See vs page GENERAL: no distress Neck: a healed  scar is present.  i do not appreciate a nodule in the thyroid or elsewhere in the neck.    Lab Results  Component Value Date   TSH 0.07* 12/15/2013      Assessment & Plan:  Differentiated thyroid cancer: given low stage and long disease-free interval, she has low risk of recurrence. Postsurgical hypothyroidism: given the above, our goal is a normal TSH. Postsurgical hypoparathyroidism: improved, so all we need to do now is be sure vit-d is ok.

## 2013-12-15 NOTE — Patient Instructions (Signed)
blood tests are being requested for you today.  We'll contact you with results.   Please return in 1 year.   

## 2013-12-16 LAB — VITAMIN D 25 HYDROXY (VIT D DEFICIENCY, FRACTURES): Vit D, 25-Hydroxy: 33 ng/mL (ref 30–89)

## 2013-12-18 LAB — THYROGLOBULIN LEVEL: Thyroglobulin: <0.2 ng/mL (ref 0.0–55.0)

## 2013-12-18 LAB — THYROGLOBULIN ANTIBODY: Thyroglobulin Ab: <20 IU/mL (ref <40.0)

## 2013-12-19 ENCOUNTER — Telehealth: Payer: Self-pay | Admitting: Endocrinology

## 2013-12-19 NOTE — Telephone Encounter (Signed)
Requested call back.  

## 2013-12-19 NOTE — Telephone Encounter (Signed)
Pt informed

## 2013-12-19 NOTE — Telephone Encounter (Signed)
Pt returning call for lab results  

## 2014-01-03 ENCOUNTER — Ambulatory Visit: Payer: 59 | Admitting: Podiatry

## 2014-01-11 ENCOUNTER — Encounter: Payer: Self-pay | Admitting: Podiatry

## 2014-01-11 ENCOUNTER — Ambulatory Visit (INDEPENDENT_AMBULATORY_CARE_PROVIDER_SITE_OTHER): Payer: 59 | Admitting: Podiatry

## 2014-01-11 VITALS — BP 126/76 | HR 71 | Resp 16

## 2014-01-11 DIAGNOSIS — M722 Plantar fascial fibromatosis: Secondary | ICD-10-CM

## 2014-01-11 MED ORDER — DICLOFENAC SODIUM 75 MG PO TBEC: 75.0000 mg | DELAYED_RELEASE_TABLET | Freq: Two times a day (BID) | ORAL | Status: AC

## 2014-01-11 MED ORDER — TRIAMCINOLONE ACETONIDE 10 MG/ML IJ SUSP
10.0000 mg | Freq: Once | INTRAMUSCULAR | Status: AC
  Administered 2014-01-11: 10 mg

## 2014-01-11 NOTE — Progress Notes (Signed)
Subjective:     Patient ID: Vickie Valdez, female   DOB: 1955/09/08, 58 y.o.   MRN: [id number]  HPI patient presents with pain in the heels left being much worse than right that simply has not improved over the last 7 months since I've seen her   Review of Systems     Objective:   Physical Exam Neurovascular status intact no changes in health history with discomfort in the plantar heel left at the insertion of the tendon into the calcaneus with fluid buildup noted. Right is mildly tender    Assessment:     Severe plantar fasciitis left heel insertion into the calcaneus    Plan:     Reviewed condition at great length and discussed treatment options including thought of surgery at this time. We're going to try aggressive conservative care and today I injected the plantar fascia 3 mg Kenalog 5 of vesica Marcaine mixture and dispensed air fracture walker to completely take pressure off the plantar heel. Placed on diclofenac 75 mg twice a day

## 2014-01-31 ENCOUNTER — Encounter: Payer: Self-pay | Admitting: Podiatry

## 2014-01-31 ENCOUNTER — Ambulatory Visit (INDEPENDENT_AMBULATORY_CARE_PROVIDER_SITE_OTHER): Payer: 59 | Admitting: Podiatry

## 2014-01-31 VITALS — BP 125/78 | HR 70 | Resp 16

## 2014-01-31 DIAGNOSIS — M722 Plantar fascial fibromatosis: Secondary | ICD-10-CM

## 2014-02-01 NOTE — Progress Notes (Signed)
Subjective:     Patient ID: Vickie Valdez, female   DOB: [birth year], 58 y.o.   MRN: [id number]  HPI patient presents stating my heel has been doing pretty well with boot but when I take it off my heel starts to bother me again fairly rapidly. States it is better than it was previously but still giving her some problems   Review of Systems     Objective:   Physical Exam Neurovascular status intact with diminished discomfort plantar heel left upon palpation with boot that she is wearing at the current time    Assessment:     Is improving with boot usage left plantar fashia    Plan:     Reviewed condition and discussed gradual weaning off of the boot and increased activity along with orthotics. May ultimately require surgery if symptoms were to persist or get worse

## 2014-02-06 ENCOUNTER — Telehealth: Payer: Self-pay | Admitting: Endocrinology

## 2014-02-06 NOTE — Telephone Encounter (Signed)
Pt would like to know the status of the handicap placard form please call her when it is complete

## 2014-02-06 NOTE — Telephone Encounter (Signed)
Called pt and informed pt that she would have to refer her request for her parking sticker to her PCP. Requested pt to call back and let us know when she could pick up form.

## 2014-03-21 ENCOUNTER — Ambulatory Visit: Payer: 59 | Admitting: Podiatry

## 2014-04-05 ENCOUNTER — Other Ambulatory Visit: Payer: Self-pay | Admitting: Family Medicine

## 2014-05-10 ENCOUNTER — Encounter: Payer: Self-pay | Admitting: General Surgery

## 2014-05-17 ENCOUNTER — Encounter: Payer: Self-pay | Admitting: Podiatry

## 2014-05-17 ENCOUNTER — Ambulatory Visit (INDEPENDENT_AMBULATORY_CARE_PROVIDER_SITE_OTHER): Payer: 59 | Admitting: Podiatry

## 2014-05-17 VITALS — BP 112/78 | HR 74 | Resp 17

## 2014-05-17 DIAGNOSIS — L84 Corns and callosities: Secondary | ICD-10-CM

## 2014-05-17 NOTE — Progress Notes (Signed)
Subjective:     Patient ID: Vickie Valdez, female   DOB: 1955/12/17, 58 y.o.   MRN: [id number]  HPI patient has calluses on both feet that are painful   Review of Systems     Objective:   Physical Exam Neurovascular status intact with thick keratotic lesion x3 plantar feet    Assessment:     Callus formation that's pain    Plan:     Debridement lesions on both feet

## 2014-06-25 ENCOUNTER — Encounter (HOSPITAL_BASED_OUTPATIENT_CLINIC_OR_DEPARTMENT_OTHER): Payer: Self-pay | Admitting: *Deleted

## 2014-06-25 ENCOUNTER — Emergency Department (HOSPITAL_BASED_OUTPATIENT_CLINIC_OR_DEPARTMENT_OTHER)
Admission: EM | Admit: 2014-06-25 | Discharge: 2014-06-25 | Disposition: A | Discharge status: Home / Self Care | Payer: 59 | Attending: Emergency Medicine | Admitting: Emergency Medicine

## 2014-06-25 ENCOUNTER — Emergency Department (HOSPITAL_BASED_OUTPATIENT_CLINIC_OR_DEPARTMENT_OTHER): Payer: 59

## 2014-06-25 DIAGNOSIS — X58XXXA Exposure to other specified factors, initial encounter: Secondary | ICD-10-CM | POA: Diagnosis not present

## 2014-06-25 DIAGNOSIS — Y9289 Other specified places as the place of occurrence of the external cause: Secondary | ICD-10-CM | POA: Insufficient documentation

## 2014-06-25 DIAGNOSIS — K219 Gastro-esophageal reflux disease without esophagitis: Secondary | ICD-10-CM | POA: Diagnosis not present

## 2014-06-25 DIAGNOSIS — E209 Hypoparathyroidism, unspecified: Secondary | ICD-10-CM | POA: Diagnosis not present

## 2014-06-25 DIAGNOSIS — S99912A Unspecified injury of left ankle, initial encounter: Secondary | ICD-10-CM | POA: Diagnosis present

## 2014-06-25 DIAGNOSIS — Z791 Long term (current) use of non-steroidal anti-inflammatories (NSAID): Secondary | ICD-10-CM | POA: Insufficient documentation

## 2014-06-25 DIAGNOSIS — E890 Postprocedural hypothyroidism: Secondary | ICD-10-CM | POA: Insufficient documentation

## 2014-06-25 DIAGNOSIS — Y998 Other external cause status: Secondary | ICD-10-CM | POA: Diagnosis not present

## 2014-06-25 DIAGNOSIS — Z880 Allergy status to penicillin: Secondary | ICD-10-CM | POA: Insufficient documentation

## 2014-06-25 DIAGNOSIS — S93602A Unspecified sprain of left foot, initial encounter: Secondary | ICD-10-CM | POA: Diagnosis not present

## 2014-06-25 DIAGNOSIS — Y9389 Activity, other specified: Secondary | ICD-10-CM | POA: Diagnosis not present

## 2014-06-25 DIAGNOSIS — Z85850 Personal history of malignant neoplasm of thyroid: Secondary | ICD-10-CM | POA: Diagnosis not present

## 2014-06-25 DIAGNOSIS — Z79899 Other long term (current) drug therapy: Secondary | ICD-10-CM | POA: Insufficient documentation

## 2014-06-25 DIAGNOSIS — E669 Obesity, unspecified: Secondary | ICD-10-CM | POA: Diagnosis not present

## 2014-06-25 DIAGNOSIS — K589 Irritable bowel syndrome without diarrhea: Secondary | ICD-10-CM | POA: Diagnosis not present

## 2014-06-25 DIAGNOSIS — M79672 Pain in left foot: Secondary | ICD-10-CM

## 2014-06-25 MED ORDER — HYDROCODONE-ACETAMINOPHEN 5-325 MG PO TABS
1.0000 | ORAL_TABLET | Freq: Once | ORAL | Status: AC
  Administered 2014-06-25: 1 via ORAL
  Filled 2014-06-25: qty 1

## 2014-06-25 MED ORDER — HYDROCODONE-ACETAMINOPHEN 5-325 MG PO TABS: 1.0000 | ORAL_TABLET | ORAL | Status: DC | PRN

## 2014-06-25 NOTE — ED Provider Notes (Signed)
CSN: [id number]     Arrival date & time 06/25/14  0002 History  This chart was scribed for Julianne Rice, MD by Peyton Bottoms, ED Scribe. This patient was seen in room MH08/MH08 and the patient's care was started at 12:24 AM.   Chief Complaint  Patient presents with  . Ankle Pain   Patient is a 58 y.o. female presenting with ankle pain. The history is provided by the patient. No language interpreter was used.  Ankle Pain Associated symptoms: no back pain and no fever     HPI Comments: Nijae Doyel is a 58 y.o. female with a history of plantar fascitis, who presents to the Emergency Department complaining of left foot pain after twisting her foot around 6:30 PM while trying on shoes. Pain gradually worsened til 10:00 PM. She states her pain has remained unchanged since then. She states she took ibuprofen with moderate relief. She states that it is painful to bear weight on left foot. Also noted mild lateral foot swelling.  Past Medical History  Diagnosis Date  . HYPOTHYROIDISM, POSTSURGICAL 02/20/2008  . 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 THYROID CANCER 02/20/2008    stage 1 papillary adenocarcinoma, follicular variant 37/90: thyroidectomy for 11mm papillary ca, follicular variant 24/09: [facility] I-131 rx , post-therapy scan pos only at the neck 1/10: tg undectectable (ab neg) neg thyrogen scan 05/10: tg undetectable (ab neg) 11/10: tg undetectable (ab neg) 5/11: tg undectectable (ab neg)  . Other dysphagia 11/09/2007  . GERD 11/08/2007  . Multinodular goiter   . IBS (irritable bowel syndrome)   . Obesity   . Allergic rhinitis    Past Surgical History  Procedure Laterality Date  . Biopsy thyroid  10/14/2007  . Thyroid ultrasound  08/12/2007  . Thyroidecctomy     Family History  Problem Relation Age of Onset  . Thyroid disease Sister     Hyperthyroidism   History  Substance Use Topics  . Smoking status: Never Smoker   . Smokeless tobacco: Not on file  . Alcohol Use: No    OB History    No data available     Review of Systems  Constitutional: Negative for fever and chills.  Musculoskeletal: Positive for joint swelling and arthralgias. Negative for myalgias and back pain.  Skin: Negative for rash.  Neurological: Negative for weakness and numbness.  All other systems reviewed and are negative.  Allergies  Aspirin and Penicillins  Home Medications   Prior to Admission medications   Medication Sig Start Date End Date Taking? Authorizing Provider  busPIRone (BUSPAR) 7.5 MG tablet Take 7.5 mg by mouth 2 (two) times daily.  12/03/11   Historical Provider, MD  Calcium Carb-Cholecalciferol (CALCIUM 600 + D PO) Take 1 tablet by mouth daily.    Historical Provider, MD  clidinium-chlordiazePOXIDE (LIBRAX) 2.5-5 MG per capsule Take 1 capsule by mouth 4 (four) times daily as needed.      Historical Provider, MD  diclofenac (VOLTAREN) 75 MG EC tablet Take 1 tablet (75 mg total) by mouth 2 (two) times daily. 01/11/14   Wallene Huh, DPM  estradiol (ESTRACE) 0.1 MG/GM vaginal cream Place 1 Applicatorful vaginally 3 (three) times a week.    Historical Provider, MD  HYDROcodone-acetaminophen (NORCO) 5-325 MG per tablet Take 1 tablet by mouth every 4 (four) hours as needed for moderate pain or severe pain. 06/25/14   Julianne Rice, MD  levothyroxine (SYNTHROID, LEVOTHROID) 100 MCG tablet Take 1 tablet (100 mcg total) by mouth daily before breakfast. 12/15/13  Renato Shin, MD  Multiple Vitamin (MULTIVITAMIN) tablet Take 1 tablet by mouth daily.      Historical Provider, MD  omeprazole (PRILOSEC) 20 MG capsule Take 20 mg by mouth daily.  09/29/11   Historical Provider, MD  Probiotic Product (ALIGN) 4 MG CAPS Take 4 mg by mouth daily.    Historical Provider, MD   There were no vitals taken for this visit. Physical Exam  Constitutional: She is oriented to person, place, and time. She appears well-developed and well-nourished. No distress.  HENT:  Head: Normocephalic and  atraumatic.  Neck: Normal range of motion. Neck supple.  Pulmonary/Chest: Effort normal.  Musculoskeletal: Normal range of motion. She exhibits edema and tenderness.  Patient with tenderness to palpation over the lateral dorsum of the left foot there is mild swelling. Patient has no tenderness over the bilateral malleolus. She has no calcaneal or plantar tenderness. Patient has no proximal fibular tenderness. Distal pulses intact. Good cap refill. No obvious deformity.  Neurological: She is alert and oriented to person, place, and time.  Sensation intact. Moves all extremities without deficit.  Skin: Skin is warm and dry. No rash noted. No erythema.  Psychiatric: She has a normal mood and affect. Her behavior is normal.  Nursing note and vitals reviewed.   ED Course  Procedures (including critical care time)  DIAGNOSTIC STUDIES:  COORDINATION OF CARE: 12:29 AM- Discussed plans to order diagnostic imaging of left foot. Pt advised of plan for treatment and pt agrees.  Labs Review Labs Reviewed - No data to display  Imaging Review Dg Foot Complete Left  06/25/2014   CLINICAL DATA:  Left ankle pain beginning earlier today around 6:30 a.m. Pain gradually worsening. Painful to bear weight. History of plantar fasciitis.  EXAM: LEFT FOOT - COMPLETE 3+ VIEW  COMPARISON:  None.  FINDINGS: Postoperative changes consistent with hallux valgus repair. 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 Mild degenerative changes in the first metatarsal-phalangeal joint and multiple interphalangeal joints. Plantar calcaneal spur. Vague linear sclerosis along the base of the third, fourth, and fifth metatarsal bones may represent stress reaction.  IMPRESSION: Old postoperative and degenerative changes. Linear sclerosis in the base of the third through fifth metatarsal bones may indicate stress reaction. No displaced fractures identified.   Electronically Signed   By: Lucienne Capers M.D.   On: 06/25/2014  00:47     EKG Interpretation None     MDM   Final diagnoses:  Left foot pain  Foot sprain, left, initial encounter      I personally performed the services described in this documentation, which was scribed in my presence. The recorded information has been reviewed and is accurate.  X-ray without any acute fractures. We'll treat for left foot sprain. Return precautions given.   Julianne Rice, MD 06/25/14 [phone]

## 2014-06-25 NOTE — ED Notes (Addendum)
Pt states she thinks she twisted her left ankle around 6pm. States pain was worse around 10pm. States she took some ibuprofen and has had some relief since then. Pt has a history of plantar issues and has a boot for this that  she wore to the ED to stabilize her ankle. Can bear weight but very painful

## 2014-06-25 NOTE — Discharge Instructions (Signed)
Foot Sprain The muscles and cord like structures which attach muscle to bone (tendons) that surround the feet are made up of units. A foot sprain can occur at the weakest spot in any of these units. This condition is most often caused by injury to or overuse of the foot, as from playing contact sports, or aggravating a previous injury, or from poor conditioning, or obesity. SYMPTOMS  Pain with movement of the foot.  Tenderness and swelling at the injury site.  Loss of strength is present in moderate or severe sprains. THE THREE GRADES OR SEVERITY OF FOOT SPRAIN ARE:  Mild (Grade I): Slightly pulled muscle without tearing of muscle or tendon fibers or loss of strength.  Moderate (Grade II): Tearing of fibers in a muscle, tendon, or at the attachment to bone, with small decrease in strength.  Severe (Grade III): Rupture of the muscle-tendon-bone attachment, with separation of fibers. Severe sprain requires surgical repair. Often repeating (chronic) sprains are caused by overuse. Sudden (acute) sprains are caused by direct injury or over-use. DIAGNOSIS  Diagnosis of this condition is usually by your own observation. If problems continue, a caregiver may be required for further evaluation and treatment. X-rays may be required to make sure there are not breaks in the bones (fractures) present. Continued problems may require physical therapy for treatment. PREVENTION  Use strength and conditioning exercises appropriate for your sport.  Warm up properly prior to working out.  Use athletic shoes that are made for the sport you are participating in.  Allow adequate time for healing. Early return to activities makes repeat injury more likely, and can lead to an unstable arthritic foot that can result in prolonged disability. Mild sprains generally heal in 3 to 10 days, with moderate and severe sprains taking 2 to 10 weeks. Your caregiver can help you determine the proper time required for  healing. HOME CARE INSTRUCTIONS   Apply ice to the injury for 15-20 minutes, 03-04 times per day. Put the ice in a plastic bag and place a towel between the bag of ice and your skin.  An elastic wrap (like an Ace bandage) may be used to keep swelling down.  Keep foot above the level of the heart, or at least raised on a footstool, when swelling and pain are present.  Try to avoid use other than gentle range of motion while the foot is painful. Do not resume use until instructed by your caregiver. Then begin use gradually, not increasing use to the point of pain. If pain does develop, decrease use and continue the above measures, gradually increasing activities that do not cause discomfort, until you gradually achieve normal use.  Use crutches if and as instructed, and for the length of time instructed.  Keep injured foot and ankle wrapped between treatments.  Massage foot and ankle for comfort and to keep swelling down. Massage from the toes up towards the knee.  Only take over-the-counter or prescription medicines for pain, discomfort, or fever as directed by your caregiver. SEEK IMMEDIATE MEDICAL CARE IF:   Your pain and swelling increase, or pain is not controlled with medications.  You have loss of feeling in your foot or your foot turns cold or blue.  You develop new, unexplained symptoms, or an increase of the symptoms that brought you to your caregiver. MAKE SURE YOU:   Understand these instructions.  Will watch your condition.  Will get help right away if you are not doing well or get worse. Document Released:   01/16/2002 Document Revised: 10/19/2011 Document Reviewed: 03/15/2008 Exit[employer] Patient Information 2015 [employer]. This information is not intended to replace advice given to you by your health care provider. Make sure you discuss any questions you have with your health care provider.  

## 2014-06-26 ENCOUNTER — Encounter: Payer: Self-pay | Admitting: Podiatry

## 2014-06-26 ENCOUNTER — Ambulatory Visit (INDEPENDENT_AMBULATORY_CARE_PROVIDER_SITE_OTHER): Payer: 59

## 2014-06-26 ENCOUNTER — Ambulatory Visit (INDEPENDENT_AMBULATORY_CARE_PROVIDER_SITE_OTHER): Payer: 59 | Admitting: Podiatry

## 2014-06-26 ENCOUNTER — Encounter: Payer: Self-pay | Admitting: *Deleted

## 2014-06-26 VITALS — BP 132/75 | HR 79 | Resp 18

## 2014-06-26 DIAGNOSIS — S99922A Unspecified injury of left foot, initial encounter: Secondary | ICD-10-CM

## 2014-06-26 NOTE — Progress Notes (Signed)
Subjective:     Patient ID: Vickie Valdez, female   DOB: [birth year], 58 y.o.   MRN: [id number]  HPIpatient presents stating that I twisted my ankle and foot on Saturday and we were unsure as to whether or not there was a fracture and it has been swollen and painful   Review of Systems     Objective:   Physical Exam Neurovascular status intact no other changes in health history noted with exquisite discomfort noted on the dorsum of the left foot around the metatarsal bases cuneiform lateral side with good ankle motion and no indication of inversion type sprain    Assessment:     Possible forefoot fracture left versus inflammatory sprained foot condition    Plan:     X-ray reviewed and today I iced the area and instructed on continued boot usage. Patient should do fine will be seen back to recheck

## 2014-11-29 ENCOUNTER — Other Ambulatory Visit: Payer: Self-pay | Admitting: Endocrinology

## 2014-12-26 ENCOUNTER — Encounter: Payer: Self-pay | Admitting: Endocrinology

## 2014-12-26 ENCOUNTER — Ambulatory Visit (INDEPENDENT_AMBULATORY_CARE_PROVIDER_SITE_OTHER): Payer: 59 | Admitting: Endocrinology

## 2014-12-26 ENCOUNTER — Telehealth: Payer: Self-pay | Admitting: Endocrinology

## 2014-12-26 VITALS — BP 126/82 | HR 74 | Temp 98.7°F | Wt 182.0 lb

## 2014-12-26 DIAGNOSIS — C73 Malignant neoplasm of thyroid gland: Secondary | ICD-10-CM

## 2014-12-26 DIAGNOSIS — E890 Postprocedural hypothyroidism: Secondary | ICD-10-CM

## 2014-12-26 LAB — TSH: TSH: 0.42 u[IU]/mL (ref 0.35–4.50)

## 2014-12-26 NOTE — Telephone Encounter (Signed)
Patient wanted to ask Dr Loanne Drilling a question about her finger nail, she stated that they are are breaking off and really weak. Please advise

## 2014-12-26 NOTE — Telephone Encounter (Signed)
Is this related to the thyroid.  i'll have to see there result.  i'll let you know

## 2014-12-26 NOTE — Patient Instructions (Addendum)
blood tests are being requested for you today.  We'll contact you with results.   Please return in 2 years.

## 2014-12-26 NOTE — Telephone Encounter (Signed)
See note below and please advise, Thanks! 

## 2014-12-26 NOTE — Progress Notes (Signed)
Subjective:    Patient ID: Vickie Valdez, female    DOB: 03-26-1956, 59 y.o.   MRN: [id number]  HPI stage-1 papillary adenocarcinoma of the thyroid:  pt says she does not notice any nodule at the neck.  stage 1 papillary adenocarcinoma, follicular variant 2/87: thyroidectomy for 10 mm papillary ca, follicular variant (margins clear, T1 N0 M0). 8/09: [facility] i-131 rx 8/09: post-therapy scan pos only at the neck.   1/10:  tg undetectable (ab neg) 1/10: neg thyrogen scan 5/10: tg undetectable (ab neg) 11/10: tg undetectable (ab neg) 5/11:  tg undetectable (ab neg) 5/13: tg undetectable (ab neg) 5/14: tg undetectable (ab neg) 5/15: tg undetectable (ab neg) postsurgical hypothyroidism:  she takes synthroid as rx'ed.  Denies fatigue.   Past Medical History  Diagnosis Date  . HYPOTHYROIDISM, POSTSURGICAL 02/20/2008  . 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 THYROID CANCER 02/20/2008    stage 1 papillary adenocarcinoma, follicular variant 86/76: thyroidectomy for 62mm papillary ca, follicular variant 72/09: [facility] I-131 rx , post-therapy scan pos only at the neck 1/10: tg undectectable (ab neg) neg thyrogen scan 05/10: tg undetectable (ab neg) 11/10: tg undetectable (ab neg) 5/11: tg undectectable (ab neg)  . Other dysphagia 11/09/2007  . GERD 11/08/2007  . Multinodular goiter   . IBS (irritable bowel syndrome)   . Obesity   . Allergic rhinitis     Past Surgical History  Procedure Laterality Date  . Biopsy thyroid  10/14/2007  . Thyroid ultrasound  08/12/2007  . Thyroidecctomy      History   Social History  . Marital Status: Married    Spouse Name: N/A  . Number of Children: N/A  . Years of Education: N/A   Occupational History  . Administrator      [employer]   Social History Main Topics  . Smoking status: Never Smoker   . Smokeless tobacco: Not on file  . Alcohol Use: No  . Drug Use: No  . Sexual Activity: Not on file   Other Topics Concern  . Not on file    Social History Narrative   Regular exercise-yes    Current Outpatient Prescriptions on File Prior to Visit  Medication Sig Dispense Refill  . busPIRone (BUSPAR) 7.5 MG tablet Take 7.5 mg by mouth 2 (two) times daily.     . Calcium Carb-Cholecalciferol (CALCIUM 600 + D PO) Take 1 tablet by mouth daily.    . clidinium-chlordiazePOXIDE (LIBRAX) 2.5-5 MG per capsule Take 1 capsule by mouth 4 (four) times daily as needed.      . diclofenac (VOLTAREN) 75 MG EC tablet Take 1 tablet (75 mg total) by mouth 2 (two) times daily. 60 tablet 1  . estradiol (ESTRACE) 0.1 MG/GM vaginal cream Place 1 Applicatorful vaginally 3 (three) times a week.    Marland Kitchen HYDROcodone-acetaminophen (NORCO) 5-325 MG per tablet Take 1 tablet by mouth every 4 (four) hours as needed for moderate pain or severe pain. 20 tablet 0  . hyoscyamine (LEVSIN, ANASPAZ) 0.125 MG tablet     . Multiple Vitamin (MULTIVITAMIN) tablet Take 1 tablet by mouth daily.      Marland Kitchen omeprazole (PRILOSEC) 20 MG capsule Take 20 mg by mouth daily.     . Probiotic Product (ALIGN) 4 MG CAPS Take 4 mg by mouth daily.    Marland Kitchen SYNTHROID 100 MCG tablet TAKE 1 TABLET DAILY BEFORE BREAKFAST 90 tablet 0   No current facility-administered medications on file prior to visit.    Allergies  Allergen  Reactions  . Aspirin     Upset GI System  . Penicillins Rash    Family History  Problem Relation Age of Onset  . Thyroid disease Sister     Hyperthyroidism    BP 126/82 mmHg  Pulse 74  Temp(Src) 98.7 F (37.1 C) (Oral)  Wt 182 lb (82.555 kg)  SpO2 98%   Review of Systems Denies weight change    Objective:   Physical Exam VITAL SIGNS:  See vs page GENERAL: no distress Neck: a healed scar is present.  i do not appreciate a nodule in the thyroid or elsewhere in the neck   Lab Results  Component Value Date   TSH 0.42 12/26/2014  TG=undetectable    Assessment & Plan:  Postsurgical hypothyroidism: well-replaced.  She does not need TSH suppression,  given long caner-free interval. Thyroid cancer: no evidence of recurrence  Patient is advised the following: Patient Instructions  blood tests are being requested for you today.  We'll contact you with results.   Please return in 2 years.   addendum: Please continue the same medications

## 2014-12-27 LAB — THYROGLOBULIN ANTIBODY: Thyroglobulin Ab: <1 IU/mL (ref <2)

## 2014-12-27 LAB — THYROGLOBULIN LEVEL: Thyroglobulin: 0.1 ng/mL — ABNORMAL LOW (ref 2.8–40.9)

## 2014-12-27 NOTE — Telephone Encounter (Signed)
I contacted the patient and advised since her thyroid labs came back normal and per Dr. Loanne Drilling, her nail symptoms are not thyroid related. Patient voiced understanding.

## 2015-02-01 ENCOUNTER — Ambulatory Visit: Payer: 59 | Admitting: Podiatry

## 2015-02-07 ENCOUNTER — Encounter: Payer: Self-pay | Admitting: Podiatry

## 2015-02-07 ENCOUNTER — Ambulatory Visit: Payer: 59 | Admitting: Podiatry

## 2015-02-07 ENCOUNTER — Ambulatory Visit (INDEPENDENT_AMBULATORY_CARE_PROVIDER_SITE_OTHER): Payer: 59 | Admitting: Podiatry

## 2015-02-07 VITALS — BP 130/83 | HR 70 | Resp 18

## 2015-02-07 DIAGNOSIS — L851 Acquired keratosis [keratoderma] palmaris et plantaris: Secondary | ICD-10-CM

## 2015-02-07 DIAGNOSIS — L84 Corns and callosities: Secondary | ICD-10-CM

## 2015-02-07 NOTE — Progress Notes (Signed)
Subjective:     Patient ID: Vickie Valdez, female   DOB: 12-01-1955, 59 y.o.   MRN: [id number]  HPI patient presents with painful calluses plantar aspect left foot   Review of Systems     Objective:   Physical Exam Neurovascular status intact with keratotic lesion 2 plantar aspect left foot    Assessment:     Lesions plantar aspect left foot that are painful    Plan:     Debride several lesions plantar left with no iatrogenic bleeding noted

## 2015-05-21 ENCOUNTER — Other Ambulatory Visit: Payer: Self-pay | Admitting: Endocrinology

## 2015-08-24 ENCOUNTER — Other Ambulatory Visit: Payer: Self-pay | Admitting: Endocrinology

## 2015-11-21 ENCOUNTER — Other Ambulatory Visit: Payer: Self-pay

## 2015-11-21 ENCOUNTER — Other Ambulatory Visit: Payer: Self-pay | Admitting: Endocrinology

## 2016-01-14 ENCOUNTER — Other Ambulatory Visit: Payer: Self-pay | Admitting: Family Medicine

## 2016-01-14 DIAGNOSIS — R1032 Left lower quadrant pain: Secondary | ICD-10-CM

## 2016-01-22 ENCOUNTER — Ambulatory Visit
Admission: RE | Admit: 2016-01-22 | Discharge: 2016-01-22 | Disposition: A | Discharge status: Home / Self Care | Payer: 59 | Source: Ambulatory Visit | Attending: Family Medicine | Admitting: Family Medicine

## 2016-01-22 DIAGNOSIS — R1032 Left lower quadrant pain: Secondary | ICD-10-CM

## 2016-01-22 MED ORDER — IOPAMIDOL (ISOVUE-300) INJECTION 61%
100.0000 mL | Freq: Once | INTRAVENOUS | Status: AC | PRN
  Administered 2016-01-22: 100 mL via INTRAVENOUS

## 2016-03-02 ENCOUNTER — Telehealth: Payer: Self-pay | Admitting: Family Medicine

## 2016-03-02 ENCOUNTER — Other Ambulatory Visit: Payer: Self-pay | Admitting: Endocrinology

## 2016-03-02 NOTE — Telephone Encounter (Signed)
Patient is at [facility] on [address] and the pharmacist said they have sent in a request to get her Synthroid prescription filled twice and they are not getting a confirmation to refill it, and she wants to know why because she needs her prescription filled.

## 2016-03-03 ENCOUNTER — Other Ambulatory Visit: Payer: Self-pay

## 2016-03-03 MED ORDER — LEVOTHYROXINE SODIUM 100 MCG PO TABS: 100.0000 ug | ORAL_TABLET | Freq: Every day | ORAL | 0 refills | Status: DC

## 2016-03-03 NOTE — Telephone Encounter (Addendum)
SENT 30 DAY SUPPLY TO PHARMACY WITH NOTE THAT PATIENT WILL NEED TO BE SEEN BY PROVIDER FOR FUTURE REFILLS.

## 2016-03-05 ENCOUNTER — Other Ambulatory Visit: Payer: Self-pay | Admitting: Family Medicine

## 2016-03-05 DIAGNOSIS — R1012 Left upper quadrant pain: Secondary | ICD-10-CM

## 2016-03-12 ENCOUNTER — Other Ambulatory Visit: Payer: 59

## 2016-03-13 ENCOUNTER — Ambulatory Visit
Admission: RE | Admit: 2016-03-13 | Discharge: 2016-03-13 | Disposition: A | Discharge status: Home / Self Care | Payer: 59 | Source: Ambulatory Visit | Attending: Family Medicine | Admitting: Family Medicine

## 2016-03-13 DIAGNOSIS — R1012 Left upper quadrant pain: Secondary | ICD-10-CM

## 2016-05-23 ENCOUNTER — Other Ambulatory Visit: Payer: Self-pay | Admitting: Endocrinology

## 2016-05-23 NOTE — Telephone Encounter (Signed)
Please refill x 1 Ov is due  

## 2016-08-11 DIAGNOSIS — E539 Vitamin B deficiency, unspecified: Secondary | ICD-10-CM | POA: Diagnosis not present

## 2016-08-11 DIAGNOSIS — E559 Vitamin D deficiency, unspecified: Secondary | ICD-10-CM | POA: Diagnosis not present

## 2016-08-11 DIAGNOSIS — E7800 Pure hypercholesterolemia, unspecified: Secondary | ICD-10-CM | POA: Diagnosis not present

## 2016-08-18 DIAGNOSIS — E559 Vitamin D deficiency, unspecified: Secondary | ICD-10-CM | POA: Diagnosis not present

## 2016-08-18 DIAGNOSIS — E539 Vitamin B deficiency, unspecified: Secondary | ICD-10-CM | POA: Diagnosis not present

## 2016-08-18 DIAGNOSIS — E039 Hypothyroidism, unspecified: Secondary | ICD-10-CM | POA: Diagnosis not present

## 2016-08-25 DIAGNOSIS — E539 Vitamin B deficiency, unspecified: Secondary | ICD-10-CM | POA: Diagnosis not present

## 2016-08-25 DIAGNOSIS — E7800 Pure hypercholesterolemia, unspecified: Secondary | ICD-10-CM | POA: Diagnosis not present

## 2016-08-25 DIAGNOSIS — E039 Hypothyroidism, unspecified: Secondary | ICD-10-CM | POA: Diagnosis not present

## 2016-08-26 ENCOUNTER — Other Ambulatory Visit: Payer: Self-pay | Admitting: Endocrinology

## 2016-08-26 NOTE — Telephone Encounter (Signed)
Please refill x 1 Ov is due  

## 2016-08-28 DIAGNOSIS — Z803 Family history of malignant neoplasm of breast: Secondary | ICD-10-CM | POA: Diagnosis not present

## 2016-08-28 DIAGNOSIS — Z1231 Encounter for screening mammogram for malignant neoplasm of breast: Secondary | ICD-10-CM | POA: Diagnosis not present

## 2016-08-29 ENCOUNTER — Other Ambulatory Visit: Payer: Self-pay | Admitting: Endocrinology

## 2016-08-29 NOTE — Telephone Encounter (Signed)
Please refill x 1 Ov is due  

## 2016-09-01 DIAGNOSIS — E7800 Pure hypercholesterolemia, unspecified: Secondary | ICD-10-CM | POA: Diagnosis not present

## 2016-09-01 DIAGNOSIS — E039 Hypothyroidism, unspecified: Secondary | ICD-10-CM | POA: Diagnosis not present

## 2016-09-01 DIAGNOSIS — E539 Vitamin B deficiency, unspecified: Secondary | ICD-10-CM | POA: Diagnosis not present

## 2016-09-15 DIAGNOSIS — E039 Hypothyroidism, unspecified: Secondary | ICD-10-CM | POA: Diagnosis not present

## 2016-09-15 DIAGNOSIS — E539 Vitamin B deficiency, unspecified: Secondary | ICD-10-CM | POA: Diagnosis not present

## 2016-09-15 DIAGNOSIS — E7800 Pure hypercholesterolemia, unspecified: Secondary | ICD-10-CM | POA: Diagnosis not present

## 2016-09-30 DIAGNOSIS — E039 Hypothyroidism, unspecified: Secondary | ICD-10-CM | POA: Diagnosis not present

## 2016-09-30 DIAGNOSIS — E539 Vitamin B deficiency, unspecified: Secondary | ICD-10-CM | POA: Diagnosis not present

## 2016-09-30 DIAGNOSIS — E7800 Pure hypercholesterolemia, unspecified: Secondary | ICD-10-CM | POA: Diagnosis not present

## 2016-10-06 DIAGNOSIS — L820 Inflamed seborrheic keratosis: Secondary | ICD-10-CM | POA: Diagnosis not present

## 2016-10-06 DIAGNOSIS — D2230 Melanocytic nevi of unspecified part of face: Secondary | ICD-10-CM | POA: Diagnosis not present

## 2016-10-09 DIAGNOSIS — E7800 Pure hypercholesterolemia, unspecified: Secondary | ICD-10-CM | POA: Diagnosis not present

## 2016-10-09 DIAGNOSIS — E039 Hypothyroidism, unspecified: Secondary | ICD-10-CM | POA: Diagnosis not present

## 2016-10-09 DIAGNOSIS — E539 Vitamin B deficiency, unspecified: Secondary | ICD-10-CM | POA: Diagnosis not present

## 2016-10-22 DIAGNOSIS — E538 Deficiency of other specified B group vitamins: Secondary | ICD-10-CM | POA: Diagnosis not present

## 2016-10-23 DIAGNOSIS — E039 Hypothyroidism, unspecified: Secondary | ICD-10-CM | POA: Diagnosis not present

## 2016-10-23 DIAGNOSIS — E7800 Pure hypercholesterolemia, unspecified: Secondary | ICD-10-CM | POA: Diagnosis not present

## 2016-10-29 DIAGNOSIS — E7800 Pure hypercholesterolemia, unspecified: Secondary | ICD-10-CM | POA: Diagnosis not present

## 2016-10-29 DIAGNOSIS — E039 Hypothyroidism, unspecified: Secondary | ICD-10-CM | POA: Diagnosis not present

## 2016-10-29 DIAGNOSIS — E539 Vitamin B deficiency, unspecified: Secondary | ICD-10-CM | POA: Diagnosis not present

## 2016-11-05 DIAGNOSIS — E039 Hypothyroidism, unspecified: Secondary | ICD-10-CM | POA: Diagnosis not present

## 2016-11-05 DIAGNOSIS — E539 Vitamin B deficiency, unspecified: Secondary | ICD-10-CM | POA: Diagnosis not present

## 2016-11-05 DIAGNOSIS — E7800 Pure hypercholesterolemia, unspecified: Secondary | ICD-10-CM | POA: Diagnosis not present

## 2016-11-18 DIAGNOSIS — E039 Hypothyroidism, unspecified: Secondary | ICD-10-CM | POA: Diagnosis not present

## 2016-11-18 DIAGNOSIS — E7800 Pure hypercholesterolemia, unspecified: Secondary | ICD-10-CM | POA: Diagnosis not present

## 2016-11-18 DIAGNOSIS — E539 Vitamin B deficiency, unspecified: Secondary | ICD-10-CM | POA: Diagnosis not present

## 2016-11-19 ENCOUNTER — Other Ambulatory Visit: Payer: Self-pay | Admitting: Endocrinology

## 2016-12-03 DIAGNOSIS — E539 Vitamin B deficiency, unspecified: Secondary | ICD-10-CM | POA: Diagnosis not present

## 2016-12-03 DIAGNOSIS — E039 Hypothyroidism, unspecified: Secondary | ICD-10-CM | POA: Diagnosis not present

## 2016-12-03 DIAGNOSIS — E7800 Pure hypercholesterolemia, unspecified: Secondary | ICD-10-CM | POA: Diagnosis not present

## 2016-12-15 DIAGNOSIS — L920 Granuloma annulare: Secondary | ICD-10-CM | POA: Diagnosis not present

## 2016-12-23 ENCOUNTER — Other Ambulatory Visit (HOSPITAL_COMMUNITY)
Admission: RE | Admit: 2016-12-23 | Discharge: 2016-12-23 | Disposition: A | Discharge status: Home / Self Care | Payer: 59 | Source: Ambulatory Visit | Attending: Family Medicine | Admitting: Family Medicine

## 2016-12-23 ENCOUNTER — Other Ambulatory Visit: Payer: Self-pay | Admitting: Family Medicine

## 2016-12-23 DIAGNOSIS — E7800 Pure hypercholesterolemia, unspecified: Secondary | ICD-10-CM | POA: Diagnosis not present

## 2016-12-23 DIAGNOSIS — Z124 Encounter for screening for malignant neoplasm of cervix: Secondary | ICD-10-CM | POA: Insufficient documentation

## 2016-12-23 DIAGNOSIS — Z0000 Encounter for general adult medical examination without abnormal findings: Secondary | ICD-10-CM | POA: Diagnosis not present

## 2016-12-25 ENCOUNTER — Ambulatory Visit: Payer: 59 | Admitting: Endocrinology

## 2016-12-25 LAB — CYTOLOGY - PAP: Diagnosis: NEGATIVE

## 2017-01-01 ENCOUNTER — Ambulatory Visit (INDEPENDENT_AMBULATORY_CARE_PROVIDER_SITE_OTHER): Payer: 59 | Admitting: Endocrinology

## 2017-01-01 ENCOUNTER — Encounter: Payer: Self-pay | Admitting: Endocrinology

## 2017-01-01 VITALS — BP 120/80 | HR 71 | Ht 63.0 in | Wt 168.0 lb

## 2017-01-01 DIAGNOSIS — E890 Postprocedural hypothyroidism: Secondary | ICD-10-CM | POA: Diagnosis not present

## 2017-01-01 DIAGNOSIS — C73 Malignant neoplasm of thyroid gland: Secondary | ICD-10-CM

## 2017-01-01 LAB — TSH: TSH: 1.59 u[IU]/mL (ref 0.35–4.50)

## 2017-01-01 NOTE — Patient Instructions (Signed)
blood tests are requested for you today.  We'll let you know about the results.   Please return in 2 years.

## 2017-01-01 NOTE — Progress Notes (Signed)
Subjective:    Patient ID: Vickie Valdez, female    DOB: [birth year], 61 y.o.   MRN: [id number]  HPI Pt returns for f/u of stage-1 papillary adenocarcinoma of the thyroid:  pt says she does not notice any nodule at the neck.  6/09: thyroidectomy for 10 mm papillary ca, follicular variant (margins clear, T1 N0 M0). 8/09: RAI 104 mci 8/09: post-therapy scan pos only at the neck.   1/10:  tg undetectable (ab neg) 1/10: neg thyrogen scan 5/10: tg undetectable (ab neg) 11/10: tg undetectable (ab neg) 5/11:  tg undetectable (ab neg) 5/13: tg undetectable (ab neg) 5/14: tg undetectable (ab neg) 5/15: tg undetectable (ab neg) 5/15: tg=0.1 (ab neg) postsurgical hypothyroidism:  she takes synthroid as rx'ed.  Denies fatigue.  Past Medical History:  Diagnosis Date  . Allergic rhinitis   . GERD 11/08/2007  . Hypoparathyroidism ([place]) 02/20/2008  . HYPOTHYROIDISM, POSTSURGICAL 02/20/2008  . IBS (irritable bowel syndrome)   . Multinodular goiter   . Obesity   . Other dysphagia 11/09/2007  . THYROID CANCER 02/20/2008   stage 1 papillary adenocarcinoma, follicular variant 00/93: thyroidectomy for 80mm papillary ca, follicular variant 81/82: 104 mci I-131 rx , post-therapy scan pos only at the neck 1/10: tg undectectable (ab neg) neg thyrogen scan 05/10: tg undetectable (ab neg) 11/10: tg undetectable (ab neg) 5/11: tg undectectable (ab neg)    Past Surgical History:  Procedure Laterality Date  . BIOPSY THYROID  10/14/2007  . Thyroid Ultrasound  08/12/2007  . Thyroidecctomy      Social History   Social History  . Marital status: Married    Spouse name: N/A  . Number of children: N/A  . Years of education: N/A   Occupational History  . Administrator      [employer]   Social History Main Topics  . Smoking status: Never Smoker  . Smokeless tobacco: Never Used  . Alcohol use No  . Drug use: No  . Sexual activity: Not on file   Other Topics Concern  . Not on file    Social History Narrative   Regular exercise-yes    Current Outpatient Prescriptions on File Prior to Visit  Medication Sig Dispense Refill  . busPIRone (BUSPAR) 7.5 MG tablet Take 7.5 mg by mouth 2 (two) times daily.     . Calcium Carb-Cholecalciferol (CALCIUM 600 + D PO) Take 1 tablet by mouth daily.    . clidinium-chlordiazePOXIDE (LIBRAX) 2.5-5 MG per capsule Take 1 capsule by mouth 4 (four) times daily as needed.      . diclofenac (VOLTAREN) 75 MG EC tablet Take 1 tablet (75 mg total) by mouth 2 (two) times daily. 60 tablet 1  . hyoscyamine (LEVSIN, ANASPAZ) 0.125 MG tablet     . levothyroxine (SYNTHROID, LEVOTHROID) 100 MCG tablet Take 1 tablet (100 mcg total) by mouth daily before breakfast. 30 tablet 0  . Multiple Vitamin (MULTIVITAMIN) tablet Take 1 tablet by mouth daily.      . Probiotic Product (ALIGN) 4 MG CAPS Take 4 mg by mouth daily.    Marland Kitchen estradiol (ESTRACE) 0.1 MG/GM vaginal cream Place 1 Applicatorful vaginally 3 (three) times a week.    Marland Kitchen HYDROcodone-acetaminophen (NORCO) 5-325 MG per tablet Take 1 tablet by mouth every 4 (four) hours as needed for moderate pain or severe pain. (Patient not taking: Reported on 01/01/2017) 20 tablet 0  . omeprazole (PRILOSEC) 20 MG capsule Take 20 mg by mouth daily.      No current  facility-administered medications on file prior to visit.     Allergies  Allergen Reactions  . Aspirin     Upset GI System  . Penicillins Rash    Family History  Problem Relation Age of Onset  . Thyroid disease Sister        Hyperthyroidism    BP 120/80   Pulse 71   Ht 5\' 3"  (1.6 m)   Wt 168 lb (76.2 kg)   SpO2 97%   BMI 29.76 kg/m    Review of Systems She has lost a few lbs, due to her efforts.     Objective:   Physical Exam VITAL SIGNS:  See vs page GENERAL: no distress Neck: a healed scar is present.  I do not appreciate a nodule in the thyroid or elsewhere in the neck  Lab Results  Component Value Date   TSH 1.59 01/01/2017       Assessment & Plan:  Stage-1 differentiated thyroid cancer.  No clinical evidence of recurrence. Postsurgical hypothyroidism: well-replaced.  In view of long disease-free interval, normal TSH is goal.   Patient Instructions  blood tests are requested for you today.  We'll let you know about the results.   Please return in 2 years.

## 2017-01-05 ENCOUNTER — Telehealth: Payer: Self-pay | Admitting: Endocrinology

## 2017-01-05 LAB — THYROGLOBULIN LEVEL: Thyroglobulin: 0.2 ng/mL — ABNORMAL LOW

## 2017-01-05 LAB — THYROGLOBULIN ANTIBODY: Thyroglobulin Ab: <1 IU/mL (ref <2)

## 2017-01-05 NOTE — Telephone Encounter (Signed)
  Notes recorded by Renato Shin, MD on 01/01/2017 at 12:41 PM EDT please call patient: Normal Please continue the same medication       I contacted the patient and advised result note above and she voiced understanding. Patient stated at this time she had no further questions at this time.

## 2017-01-05 NOTE — Telephone Encounter (Signed)
Patient is requesting a call back. She states that she has questions above and beyond her lab results.

## 2017-01-06 NOTE — Telephone Encounter (Signed)
Notes recorded by Renato Shin, MD on 01/01/2017 at 12:41 PM EDT please call patient: Normal Please continue the same medication   Ref Range & Units 5d ago  TSH 0.35 - 4.50 uIU/mL 1.59        I contacted the patient advised of message. She wanted to verify what the reference ranges should be. Reference ranges provided and she had no further questions at this patient.

## 2017-01-06 NOTE — Telephone Encounter (Signed)
Patient asking for another return call.

## 2017-02-28 ENCOUNTER — Other Ambulatory Visit: Payer: Self-pay | Admitting: Endocrinology

## 2017-03-08 DIAGNOSIS — L920 Granuloma annulare: Secondary | ICD-10-CM | POA: Diagnosis not present

## 2017-04-22 ENCOUNTER — Other Ambulatory Visit (INDEPENDENT_AMBULATORY_CARE_PROVIDER_SITE_OTHER): Payer: 59

## 2017-04-22 ENCOUNTER — Ambulatory Visit (INDEPENDENT_AMBULATORY_CARE_PROVIDER_SITE_OTHER): Payer: 59 | Admitting: Endocrinology

## 2017-04-22 ENCOUNTER — Encounter: Payer: Self-pay | Admitting: Endocrinology

## 2017-04-22 VITALS — BP 128/88 | HR 80 | Wt 166.4 lb

## 2017-04-22 DIAGNOSIS — E890 Postprocedural hypothyroidism: Secondary | ICD-10-CM

## 2017-04-22 DIAGNOSIS — E209 Hypoparathyroidism, unspecified: Secondary | ICD-10-CM | POA: Diagnosis not present

## 2017-04-22 DIAGNOSIS — C73 Malignant neoplasm of thyroid gland: Secondary | ICD-10-CM

## 2017-04-22 LAB — TSH: TSH: 0.98 u[IU]/mL (ref 0.35–4.50)

## 2017-04-22 LAB — VITAMIN D 25 HYDROXY (VIT D DEFICIENCY, FRACTURES): VITD: 25.81 ng/mL — ABNORMAL LOW (ref 30.00–100.00)

## 2017-04-22 NOTE — Progress Notes (Signed)
Subjective:    Patient ID: Vickie Valdez, female    DOB: Jan 13, 1956, 61 y.o.   MRN: [id number]  HPI Pt returns for f/u of stage-1 papillary adenocarcinoma of the thyroid:  pt says she does not notice any nodule at the neck.  6/09: thyroidectomy for 10 mm papillary ca, follicular variant (margins clear, T1 N0 M0). 8/09: RAI 104 mci 8/09: post-therapy scan pos only at the neck.   1/10:  tg undetectable (ab neg) 1/10: neg thyrogen scan 5/10: tg undetectable (ab neg) 11/10: tg undetectable (ab neg) 5/11:  tg undetectable (ab neg) 5/13: tg undetectable (ab neg) 5/14: tg undetectable (ab neg) 5/15: tg undetectable (ab neg) 5/15: tg=0.1 (ab neg).  5/18: tg=0.2 (ab neg).   postsurgical hypothyroidism:  she takes synthroid as rx'ed.   Pt reports slight "muscle stress" in the neck, and assoc anxiety.  Past Medical History:  Diagnosis Date  . Allergic rhinitis   . GERD 11/08/2007  . Hypoparathyroidism ([place]) 02/20/2008  . HYPOTHYROIDISM, POSTSURGICAL 02/20/2008  . IBS (irritable bowel syndrome)   . Multinodular goiter   . Obesity   . Other dysphagia 11/09/2007  . THYROID CANCER 02/20/2008   stage 1 papillary adenocarcinoma, follicular variant 62/13: thyroidectomy for 27mm papillary ca, follicular variant 08/65: 104 mci I-131 rx , post-therapy scan pos only at the neck 1/10: tg undectectable (ab neg) neg thyrogen scan 05/10: tg undetectable (ab neg) 11/10: tg undetectable (ab neg) 5/11: tg undectectable (ab neg)    Past Surgical History:  Procedure Laterality Date  . BIOPSY THYROID  10/14/2007  . Thyroid Ultrasound  08/12/2007  . Thyroidecctomy      Social History   Social History  . Marital status: Married    Spouse name: N/A  . Number of children: N/A  . Years of education: N/A   Occupational History  . Administrator      [employer]   Social History Main Topics  . Smoking status: Never Smoker  . Smokeless tobacco: Never Used  . Alcohol use No  . Drug use: No    . Sexual activity: Not on file   Other Topics Concern  . Not on file   Social History Narrative   Regular exercise-yes    Current Outpatient Prescriptions on File Prior to Visit  Medication Sig Dispense Refill  . busPIRone (BUSPAR) 7.5 MG tablet Take 7.5 mg by mouth 2 (two) times daily.     . Calcium Carb-Cholecalciferol (CALCIUM 600 + D PO) Take 1 tablet by mouth daily.    . clidinium-chlordiazePOXIDE (LIBRAX) 2.5-5 MG per capsule Take 1 capsule by mouth 4 (four) times daily as needed.      . diclofenac (VOLTAREN) 75 MG EC tablet Take 1 tablet (75 mg total) by mouth 2 (two) times daily. 60 tablet 1  . estradiol (ESTRACE) 0.1 MG/GM vaginal cream Place 1 Applicatorful vaginally 3 (three) times a week.    . hyoscyamine (LEVSIN, ANASPAZ) 0.125 MG tablet     . levothyroxine (SYNTHROID, LEVOTHROID) 100 MCG tablet Take 1 tablet (100 mcg total) by mouth daily before breakfast. 30 tablet 0  . levothyroxine (SYNTHROID, LEVOTHROID) 100 MCG tablet TAKE 1 TABLET BY MOUTH EVERY DAY BEFORE BREAKFAST. NEED OFFICE VISIT FOR FURTHER REFILLS 90 tablet 0  . Multiple Vitamin (MULTIVITAMIN) tablet Take 1 tablet by mouth daily.      Marland Kitchen omeprazole (PRILOSEC) 20 MG capsule Take 20 mg by mouth daily.     . Probiotic Product (ALIGN) 4 MG CAPS Take 4  mg by mouth daily.     No current facility-administered medications on file prior to visit.     Allergies  Allergen Reactions  . Aspirin     Upset GI System  . Penicillins Rash    Family History  Problem Relation Age of Onset  . Thyroid disease Sister        Hyperthyroidism    BP 128/88   Pulse 80   Wt 166 lb 6.4 oz (75.5 kg)   SpO2 98%   BMI 29.48 kg/m    Review of Systems Denies neck swelling and tremor.      Objective:   Physical Exam VITAL SIGNS:  See vs page.  GENERAL: no distress.  Neck: a healed scar is present.  I do not appreciate a nodule in the thyroid or elsewhere in the neck.       Assessment & Plan:  Muscle cramps, new:  check vit-D and PTH Postsurgical hypothyroidism: recheck today. Differentiated thyroid cancer.  Recheck today  Patient Instructions  blood tests are requested for you today.  We'll let you know about the results.   Please come back for a follow-up appointment in 1 year.

## 2017-04-22 NOTE — Patient Instructions (Addendum)
blood tests are requested for you today.  We'll let you know about the results. Please come back for a follow-up appointment in 1 year.   

## 2017-04-23 ENCOUNTER — Telehealth: Payer: Self-pay | Admitting: Endocrinology

## 2017-04-23 NOTE — Telephone Encounter (Signed)
Patient returning phone call in reference to lab results. Please call patient and advise.

## 2017-04-26 NOTE — Telephone Encounter (Signed)
Called and discussed results with patient. She had no further questions.

## 2017-04-27 LAB — THYROGLOBULIN LEVEL: Thyroglobulin: 0.2 ng/mL — ABNORMAL LOW

## 2017-04-27 LAB — PTH, INTACT AND CALCIUM: Calcium: 9 mg/dL (ref 8.6–10.4)

## 2017-04-27 LAB — THYROGLOBULIN ANTIBODY: Thyroglobulin Ab: <1 IU/mL (ref <=1)

## 2017-04-28 ENCOUNTER — Other Ambulatory Visit: Payer: 59

## 2017-04-28 DIAGNOSIS — E209 Hypoparathyroidism, unspecified: Secondary | ICD-10-CM

## 2017-04-29 LAB — PTH, INTACT AND CALCIUM
Calcium: 9 mg/dL (ref 8.6–10.4)
PTH: 29 pg/mL (ref 14–64)

## 2017-04-30 ENCOUNTER — Telehealth: Payer: Self-pay | Admitting: Endocrinology

## 2017-04-30 NOTE — Telephone Encounter (Signed)
Patient called in reference to wanting to go over lab results. Please call patient and advise.

## 2017-05-03 ENCOUNTER — Telehealth: Payer: Self-pay | Admitting: Endocrinology

## 2017-05-03 NOTE — Telephone Encounter (Signed)
Called patient back but again had to leave VM. I stated that labs were normal except vitamin D.I also notified her there  Is really no reason to discuss lab results & to just take OTC vitamin D 6000 units.

## 2017-05-03 NOTE — Telephone Encounter (Signed)
Patient returning missed call to discuss lab results. Please call patient and advise.

## 2017-05-03 NOTE — Telephone Encounter (Signed)
Called patient and left VM that, again labs were normal except low vitamin D. She just needs to increase to 6000 units per day.

## 2017-05-31 ENCOUNTER — Other Ambulatory Visit: Payer: Self-pay | Admitting: Endocrinology

## 2017-07-23 ENCOUNTER — Ambulatory Visit: Payer: 59 | Admitting: Endocrinology

## 2017-07-23 ENCOUNTER — Telehealth: Payer: Self-pay | Admitting: Endocrinology

## 2017-07-23 ENCOUNTER — Other Ambulatory Visit: Payer: Self-pay

## 2017-07-23 ENCOUNTER — Encounter: Payer: Self-pay | Admitting: Endocrinology

## 2017-07-23 VITALS — BP 122/78 | HR 81 | Wt 168.8 lb

## 2017-07-23 DIAGNOSIS — E890 Postprocedural hypothyroidism: Secondary | ICD-10-CM | POA: Diagnosis not present

## 2017-07-23 DIAGNOSIS — C73 Malignant neoplasm of thyroid gland: Secondary | ICD-10-CM | POA: Diagnosis not present

## 2017-07-23 DIAGNOSIS — E209 Hypoparathyroidism, unspecified: Secondary | ICD-10-CM | POA: Diagnosis not present

## 2017-07-23 DIAGNOSIS — J069 Acute upper respiratory infection, unspecified: Secondary | ICD-10-CM | POA: Diagnosis not present

## 2017-07-23 LAB — VITAMIN D 25 HYDROXY (VIT D DEFICIENCY, FRACTURES): VITD: 50.68 ng/mL (ref 30.00–100.00)

## 2017-07-23 LAB — TSH: TSH: 2 u[IU]/mL (ref 0.35–4.50)

## 2017-07-23 MED ORDER — LEVOTHYROXINE SODIUM 100 MCG PO TABS: 100.0000 ug | ORAL_TABLET | Freq: Every day | ORAL | 0 refills | Status: DC

## 2017-07-23 NOTE — Patient Instructions (Signed)
blood tests are requested for you today.  We'll let you know about the results. Please come back for a follow-up appointment in 1 year.   

## 2017-07-23 NOTE — Telephone Encounter (Signed)
I have sent in for actual Synthroid.

## 2017-07-23 NOTE — Progress Notes (Signed)
Subjective:    Patient ID: Vickie Valdez, female    DOB: 12/17/1955, 61 y.o.   MRN: [id number]  HPI  The state of at least three ongoing medical problems is addressed today, with interval history of each noted here: Pt returns for f/u of stage-1 papillary adenocarcinoma of the thyroid:  pt says she does not notice any nodule at the neck.  6/09: thyroidectomy for 10 mm papillary ca, follicular variant (margins clear, T1 N0 M0).   8/09: RAI 104 mci 8/09: post-therapy scan pos only at the neck.   1/10:  tg undetectable (ab neg) 1/10: neg thyrogen scan 5/10: tg undetectable (ab neg) 11/10: tg undetectable (ab neg) 5/11:  tg undetectable (ab neg) 5/13: tg undetectable (ab neg) 5/14: tg undetectable (ab neg) 5/15: tg undetectable (ab neg) 5/15: tg=0.1 (ab neg).  5/18: tg=0.2 (ab neg).   postsurgical hypothyroidism:  she takes synthroid as rx'ed.  Pt reports slight myalgias.  Vit-D deficiency: she denies muscle cramps.  Past Medical History:  Diagnosis Date  . Allergic rhinitis   . GERD 11/08/2007  . Hypoparathyroidism ([place]) 02/20/2008  . HYPOTHYROIDISM, POSTSURGICAL 02/20/2008  . IBS (irritable bowel syndrome)   . Multinodular goiter   . Obesity   . Other dysphagia 11/09/2007  . THYROID CANCER 02/20/2008   stage 1 papillary adenocarcinoma, follicular variant 82/42: thyroidectomy for 61mm papillary ca, follicular variant 35/36: 104 mci I-131 rx , post-therapy scan pos only at the neck 1/10: tg undectectable (ab neg) neg thyrogen scan 05/10: tg undetectable (ab neg) 11/10: tg undetectable (ab neg) 5/11: tg undectectable (ab neg)    Past Surgical History:  Procedure Laterality Date  . BIOPSY THYROID  10/14/2007  . Thyroid Ultrasound  08/12/2007  . Thyroidecctomy      Social History   Socioeconomic History  . Marital status: Married    Spouse name: Not on file  . Number of children: Not on file  . Years of education: Not on file  . Highest education level: Not on file    Social Needs  . Financial resource strain: Not on file  . Food insecurity - worry: Not on file  . Food insecurity - inability: Not on file  . Transportation needs - medical: Not on file  . Transportation needs - non-medical: Not on file  Occupational History  . Occupation: Scientist, physiological     Comment: [employer]  Tobacco Use  . Smoking status: Never Smoker  . Smokeless tobacco: Never Used  Substance and Sexual Activity  . Alcohol use: No  . Drug use: No  . Sexual activity: Not on file  Other Topics Concern  . Not on file  Social History Narrative   Regular exercise-yes    Current Outpatient Medications on File Prior to Visit  Medication Sig Dispense Refill  . busPIRone (BUSPAR) 7.5 MG tablet Take 7.5 mg by mouth 2 (two) times daily.     . Calcium Carb-Cholecalciferol (CALCIUM 600 + D PO) Take 1 tablet by mouth daily.    . clidinium-chlordiazePOXIDE (LIBRAX) 2.5-5 MG per capsule Take 1 capsule by mouth 4 (four) times daily as needed.      . diclofenac (VOLTAREN) 75 MG EC tablet Take 1 tablet (75 mg total) by mouth 2 (two) times daily. 60 tablet 1  . estradiol (ESTRACE) 0.1 MG/GM vaginal cream Place 1 Applicatorful vaginally 3 (three) times a week.    . hyoscyamine (LEVSIN, ANASPAZ) 0.125 MG tablet     . levothyroxine (SYNTHROID, LEVOTHROID) 100 MCG tablet  TAKE 1 TABLET BY MOUTH EVERY DAY BEFORE BREAKFAST. NEED OFFICE VISIT FOR FURTHER REFILLS 90 tablet 0  . Multiple Vitamin (MULTIVITAMIN) tablet Take 1 tablet by mouth daily.      Marland Kitchen omeprazole (PRILOSEC) 20 MG capsule Take 20 mg by mouth daily.     . Probiotic Product (ALIGN) 4 MG CAPS Take 4 mg by mouth daily.     No current facility-administered medications on file prior to visit.     Allergies  Allergen Reactions  . Aspirin     Upset GI System  . Penicillins Rash    Family History  Problem Relation Age of Onset  . Thyroid disease Sister        Hyperthyroidism    BP 122/78 (BP Location: Left Arm, Patient  Position: Sitting, Cuff Size: Normal)   Pulse 81   Wt 168 lb 12.8 oz (76.6 kg)   SpO2 99%   BMI 29.90 kg/m    Review of Systems Denies leg edema and neck pain    Objective:   Physical Exam VITAL SIGNS:  See vs page.  GENERAL: no distress. Neck: a healed scar is present.  I do not appreciate a nodule in the thyroid or elsewhere in the neck.        Assessment & Plan:  Differentiated thyroid cancer, due for recheck postsurgical hypothyroidism: due for recheck. Vit-D deficiency: due for recheck  Patient Instructions  blood tests are requested for you today.  We'll let you know about the results.   Please come back for a follow-up appointment in 1 year.

## 2017-07-23 NOTE — Telephone Encounter (Signed)
Patient wants script for non-generic Synthroid so that she has an option sent to [facility] on [facility]

## 2017-07-23 NOTE — Telephone Encounter (Signed)
Error

## 2017-07-26 LAB — THYROGLOBULIN ANTIBODY: Thyroglobulin Ab: <1 IU/mL (ref <=1)

## 2017-07-26 LAB — THYROGLOBULIN LEVEL: Thyroglobulin: 0.2 ng/mL — ABNORMAL LOW

## 2017-07-27 DIAGNOSIS — R05 Cough: Secondary | ICD-10-CM | POA: Diagnosis not present

## 2017-07-27 DIAGNOSIS — J9801 Acute bronchospasm: Secondary | ICD-10-CM | POA: Diagnosis not present

## 2017-08-06 ENCOUNTER — Other Ambulatory Visit: Payer: Self-pay

## 2017-08-06 MED ORDER — LEVOTHYROXINE SODIUM 100 MCG PO TABS: 100.0000 ug | ORAL_TABLET | Freq: Every day | ORAL | 0 refills | Status: DC

## 2017-08-18 DIAGNOSIS — E039 Hypothyroidism, unspecified: Secondary | ICD-10-CM | POA: Diagnosis not present

## 2017-08-18 DIAGNOSIS — E7800 Pure hypercholesterolemia, unspecified: Secondary | ICD-10-CM | POA: Diagnosis not present

## 2017-08-18 DIAGNOSIS — E669 Obesity, unspecified: Secondary | ICD-10-CM | POA: Diagnosis not present

## 2017-08-26 DIAGNOSIS — E669 Obesity, unspecified: Secondary | ICD-10-CM | POA: Diagnosis not present

## 2017-08-26 DIAGNOSIS — E7800 Pure hypercholesterolemia, unspecified: Secondary | ICD-10-CM | POA: Diagnosis not present

## 2017-08-26 DIAGNOSIS — E039 Hypothyroidism, unspecified: Secondary | ICD-10-CM | POA: Diagnosis not present

## 2017-08-31 DIAGNOSIS — Z803 Family history of malignant neoplasm of breast: Secondary | ICD-10-CM | POA: Diagnosis not present

## 2017-08-31 DIAGNOSIS — Z1231 Encounter for screening mammogram for malignant neoplasm of breast: Secondary | ICD-10-CM | POA: Diagnosis not present

## 2017-11-26 ENCOUNTER — Other Ambulatory Visit: Payer: Self-pay | Admitting: Endocrinology

## 2017-12-14 DIAGNOSIS — Z1211 Encounter for screening for malignant neoplasm of colon: Secondary | ICD-10-CM | POA: Diagnosis not present

## 2017-12-16 DIAGNOSIS — M545 Low back pain: Secondary | ICD-10-CM | POA: Diagnosis not present

## 2018-02-15 DIAGNOSIS — E7800 Pure hypercholesterolemia, unspecified: Secondary | ICD-10-CM | POA: Diagnosis not present

## 2018-02-15 DIAGNOSIS — Z1159 Encounter for screening for other viral diseases: Secondary | ICD-10-CM | POA: Diagnosis not present

## 2018-02-15 DIAGNOSIS — Z0000 Encounter for general adult medical examination without abnormal findings: Secondary | ICD-10-CM | POA: Diagnosis not present

## 2018-02-21 ENCOUNTER — Other Ambulatory Visit: Payer: Self-pay | Admitting: Family Medicine

## 2018-02-21 ENCOUNTER — Ambulatory Visit
Admission: RE | Admit: 2018-02-21 | Discharge: 2018-02-21 | Disposition: A | Discharge status: Home / Self Care | Payer: 59 | Source: Ambulatory Visit | Attending: Family Medicine | Admitting: Family Medicine

## 2018-02-21 DIAGNOSIS — M545 Low back pain: Secondary | ICD-10-CM

## 2018-03-04 DIAGNOSIS — E7800 Pure hypercholesterolemia, unspecified: Secondary | ICD-10-CM | POA: Diagnosis not present

## 2018-03-18 DIAGNOSIS — E669 Obesity, unspecified: Secondary | ICD-10-CM | POA: Diagnosis not present

## 2018-03-18 DIAGNOSIS — E7800 Pure hypercholesterolemia, unspecified: Secondary | ICD-10-CM | POA: Diagnosis not present

## 2018-03-28 DIAGNOSIS — E669 Obesity, unspecified: Secondary | ICD-10-CM | POA: Diagnosis not present

## 2018-03-28 DIAGNOSIS — E7800 Pure hypercholesterolemia, unspecified: Secondary | ICD-10-CM | POA: Diagnosis not present

## 2018-03-28 DIAGNOSIS — E039 Hypothyroidism, unspecified: Secondary | ICD-10-CM | POA: Diagnosis not present

## 2018-04-08 DIAGNOSIS — E7800 Pure hypercholesterolemia, unspecified: Secondary | ICD-10-CM | POA: Diagnosis not present

## 2018-04-08 DIAGNOSIS — E663 Overweight: Secondary | ICD-10-CM | POA: Diagnosis not present

## 2018-04-08 DIAGNOSIS — E559 Vitamin D deficiency, unspecified: Secondary | ICD-10-CM | POA: Diagnosis not present

## 2018-04-15 ENCOUNTER — Ambulatory Visit: Payer: 59 | Admitting: Endocrinology

## 2018-04-15 ENCOUNTER — Encounter: Payer: Self-pay | Admitting: Endocrinology

## 2018-04-15 VITALS — BP 122/68 | HR 65 | Ht 63.0 in | Wt 168.6 lb

## 2018-04-15 DIAGNOSIS — E209 Hypoparathyroidism, unspecified: Secondary | ICD-10-CM

## 2018-04-15 DIAGNOSIS — E890 Postprocedural hypothyroidism: Secondary | ICD-10-CM

## 2018-04-15 DIAGNOSIS — C73 Malignant neoplasm of thyroid gland: Secondary | ICD-10-CM

## 2018-04-15 LAB — TSH: TSH: 1.24 u[IU]/mL (ref 0.35–4.50)

## 2018-04-15 LAB — VITAMIN D 25 HYDROXY (VIT D DEFICIENCY, FRACTURES): VITD: 38.79 ng/mL (ref 30.00–100.00)

## 2018-04-15 NOTE — Progress Notes (Signed)
Subjective:    Patient ID: Vickie Valdez, female    DOB: April 02, 1956, 62 y.o.   MRN: [id number]  HPI The state of at least three ongoing medical problems is addressed today, with interval history of each noted here: Pt returns for f/u of stage-1 papillary adenocarcinoma of the thyroid:   6/09: thyroidectomy for 10 mm papillary ca, follicular variant (margins clear, T1 N0 M0).   8/09: RAI 104 mci 8/09: post-therapy scan pos only at the neck.   1/10:  tg undetectable (ab neg) 1/10: neg thyrogen scan 5/10: tg undetectable (ab neg) 11/10: tg undetectable (ab neg) 5/11:  tg undetectable (ab neg) 5/13: tg undetectable (ab neg) 5/14: tg undetectable (ab neg) 5/15: tg undetectable (ab neg) 5/15: tg=0.1 (ab neg).  5/18: tg=0.2 (ab neg).   12/18: tg=0.2 (ab neg). pt says she does not notice any nodule at the neck. This is a stable problem. postsurgical hypothyroidism:  she takes synthroid as rx'ed.  she denies myalgias.  This is a stable problem. Vit-D deficiency: she takes 4000 units qd.  she has intermitt muscle cramps. This is a stable problem. Past Medical History:  Diagnosis Date  . Allergic rhinitis   . GERD 11/08/2007  . Hypoparathyroidism ([place]) 02/20/2008  . HYPOTHYROIDISM, POSTSURGICAL 02/20/2008  . IBS (irritable bowel syndrome)   . Multinodular goiter   . Obesity   . Other dysphagia 11/09/2007  . THYROID CANCER 02/20/2008   stage 1 papillary adenocarcinoma, follicular variant 26/37: thyroidectomy for 67mm papillary ca, follicular variant 85/88: 104 mci I-131 rx , post-therapy scan pos only at the neck 1/10: tg undectectable (ab neg) neg thyrogen scan 05/10: tg undetectable (ab neg) 11/10: tg undetectable (ab neg) 5/11: tg undectectable (ab neg)    Past Surgical History:  Procedure Laterality Date  . BIOPSY THYROID  10/14/2007  . Thyroid Ultrasound  08/12/2007  . Thyroidecctomy      Social History   Socioeconomic History  . Marital status: Married    Spouse name: Not  on file  . Number of children: Not on file  . Years of education: Not on file  . Highest education level: Not on file  Occupational History  . Occupation: Scientist, physiological     Comment:  [employer]  . Financial resource strain: Not on file  . Food insecurity:    Worry: Not on file    Inability: Not on file  . Transportation needs:    Medical: Not on file    Non-medical: Not on file  Tobacco Use  . Smoking status: Never Smoker  . Smokeless tobacco: Never Used  Substance and Sexual Activity  . Alcohol use: No  . Drug use: No  . Sexual activity: Not on file  Lifestyle  . Physical activity:    Days per week: Not on file    Minutes per session: Not on file  . Stress: Not on file  Relationships  . Social connections:    Talks on phone: Not on file    Gets together: Not on file    Attends religious service: Not on file    Active member of club or organization: Not on file    Attends meetings of clubs or organizations: Not on file    Relationship status: Not on file  . Intimate partner violence:    Fear of current or ex partner: Not on file    Emotionally abused: Not on file    Physically abused: Not on file    Forced  sexual activity: Not on file  Other Topics Concern  . Not on file  Social History Narrative   Regular exercise-yes    Current Outpatient Medications on File Prior to Visit  Medication Sig Dispense Refill  . busPIRone (BUSPAR) 7.5 MG tablet Take 7.5 mg by mouth 2 (two) times daily.     . Calcium Carb-Cholecalciferol (CALCIUM 600 + D PO) Take 1 tablet by mouth daily.    . clidinium-chlordiazePOXIDE (LIBRAX) 2.5-5 MG per capsule Take 1 capsule by mouth 4 (four) times daily as needed.      . diclofenac (VOLTAREN) 75 MG EC tablet Take 1 tablet (75 mg total) by mouth 2 (two) times daily. 60 tablet 1  . estradiol (ESTRACE) 0.1 MG/GM vaginal cream Place 1 Applicatorful vaginally 3 (three) times a week.    . hyoscyamine (LEVSIN, ANASPAZ) 0.125 MG  tablet     . levothyroxine (SYNTHROID, LEVOTHROID) 100 MCG tablet Take 1 tablet (100 mcg total) by mouth daily before breakfast. 90 tablet 0  . Multiple Vitamin (MULTIVITAMIN) tablet Take 1 tablet by mouth daily.      Marland Kitchen omeprazole (PRILOSEC) 20 MG capsule Take 20 mg by mouth daily.      No current facility-administered medications on file prior to visit.     Allergies  Allergen Reactions  . Aspirin     Upset GI System  . Prednisone     Per pt "makes her feel strange"  . Penicillins Rash    Family History  Problem Relation Age of Onset  . Thyroid disease Sister        Hyperthyroidism    BP 122/68   Pulse 65   Ht 5\' 3"  (1.6 m)   Wt 168 lb 9.6 oz (76.5 kg)   SpO2 99%   BMI 29.87 kg/m    Review of Systems Denies numbness and neck pain    Objective:   Physical Exam VITAL SIGNS:  See vs page GENERAL: no distress Neck: a healed scar is present.  I do not appreciate a nodule in the thyroid or elsewhere in the neck.   Lab Results  Component Value Date   TSH 1.24 04/15/2018      Assessment & Plan:  Stage 1 differentiated thyroid cancer, due for recheck.  postsurgical hypothyroidism:  well-replaced.  Please continue the same medication Vit-D deficiency: recheck today.    Patient Instructions  blood tests are requested for you today.  We'll let you know about the results.   Please come back for a follow-up appointment in 1 year.

## 2018-04-15 NOTE — Patient Instructions (Signed)
blood tests are requested for you today.  We'll let you know about the results. Please come back for a follow-up appointment in 1 year.   

## 2018-04-18 LAB — THYROGLOBULIN ANTIBODY: Thyroglobulin Ab: <1 IU/mL (ref <=1)

## 2018-04-18 LAB — THYROGLOBULIN LEVEL: Thyroglobulin: 0.2 ng/mL — ABNORMAL LOW

## 2018-04-18 LAB — PTH, INTACT AND CALCIUM
Calcium: 9.2 mg/dL (ref 8.6–10.4)
PTH: 22 pg/mL (ref 14–64)

## 2018-04-22 ENCOUNTER — Ambulatory Visit: Payer: 59 | Admitting: Endocrinology

## 2018-06-03 ENCOUNTER — Other Ambulatory Visit: Payer: Self-pay | Admitting: Endocrinology

## 2018-06-15 DIAGNOSIS — E7800 Pure hypercholesterolemia, unspecified: Secondary | ICD-10-CM | POA: Diagnosis not present

## 2018-06-15 DIAGNOSIS — E663 Overweight: Secondary | ICD-10-CM | POA: Diagnosis not present

## 2018-06-27 DIAGNOSIS — E663 Overweight: Secondary | ICD-10-CM | POA: Diagnosis not present

## 2018-06-27 DIAGNOSIS — E7800 Pure hypercholesterolemia, unspecified: Secondary | ICD-10-CM | POA: Diagnosis not present

## 2018-07-06 DIAGNOSIS — E663 Overweight: Secondary | ICD-10-CM | POA: Diagnosis not present

## 2018-07-06 DIAGNOSIS — E7800 Pure hypercholesterolemia, unspecified: Secondary | ICD-10-CM | POA: Diagnosis not present

## 2018-07-11 DIAGNOSIS — R350 Frequency of micturition: Secondary | ICD-10-CM | POA: Diagnosis not present

## 2018-07-11 DIAGNOSIS — M545 Low back pain: Secondary | ICD-10-CM | POA: Diagnosis not present

## 2018-07-13 ENCOUNTER — Other Ambulatory Visit: Payer: Self-pay

## 2018-07-13 DIAGNOSIS — M545 Low back pain, unspecified: Secondary | ICD-10-CM

## 2018-07-16 ENCOUNTER — Ambulatory Visit
Admission: RE | Admit: 2018-07-16 | Discharge: 2018-07-16 | Disposition: A | Discharge status: Home / Self Care | Payer: 59 | Source: Ambulatory Visit | Attending: Family Medicine | Admitting: Family Medicine

## 2018-07-16 DIAGNOSIS — M48061 Spinal stenosis, lumbar region without neurogenic claudication: Secondary | ICD-10-CM | POA: Diagnosis not present

## 2018-07-16 DIAGNOSIS — M545 Low back pain, unspecified: Secondary | ICD-10-CM

## 2018-07-22 ENCOUNTER — Ambulatory Visit: Payer: 59 | Admitting: Endocrinology

## 2018-07-23 ENCOUNTER — Other Ambulatory Visit: Payer: 59

## 2018-07-25 DIAGNOSIS — M79605 Pain in left leg: Secondary | ICD-10-CM | POA: Diagnosis not present

## 2018-07-25 DIAGNOSIS — M6281 Muscle weakness (generalized): Secondary | ICD-10-CM | POA: Diagnosis not present

## 2018-07-25 DIAGNOSIS — M545 Low back pain: Secondary | ICD-10-CM | POA: Diagnosis not present

## 2018-08-01 DIAGNOSIS — M545 Low back pain, unspecified: Secondary | ICD-10-CM | POA: Insufficient documentation

## 2018-08-01 DIAGNOSIS — M5136 Other intervertebral disc degeneration, lumbar region: Secondary | ICD-10-CM | POA: Diagnosis not present

## 2018-08-08 DIAGNOSIS — M545 Low back pain: Secondary | ICD-10-CM | POA: Diagnosis not present

## 2018-08-08 DIAGNOSIS — M6281 Muscle weakness (generalized): Secondary | ICD-10-CM | POA: Diagnosis not present

## 2018-08-08 DIAGNOSIS — M79605 Pain in left leg: Secondary | ICD-10-CM | POA: Diagnosis not present

## 2018-08-20 ENCOUNTER — Other Ambulatory Visit: Payer: Self-pay | Admitting: Endocrinology

## 2018-10-29 ENCOUNTER — Other Ambulatory Visit: Payer: Self-pay | Admitting: Endocrinology

## 2019-01-19 ENCOUNTER — Other Ambulatory Visit: Payer: Self-pay

## 2019-01-20 ENCOUNTER — Encounter: Payer: Self-pay | Admitting: Endocrinology

## 2019-01-20 ENCOUNTER — Ambulatory Visit (INDEPENDENT_AMBULATORY_CARE_PROVIDER_SITE_OTHER): Payer: BC Managed Care – PPO | Admitting: Endocrinology

## 2019-01-20 VITALS — BP 116/82 | HR 62 | Temp 98.4°F | Wt 166.6 lb

## 2019-01-20 DIAGNOSIS — C73 Malignant neoplasm of thyroid gland: Secondary | ICD-10-CM | POA: Diagnosis not present

## 2019-01-20 DIAGNOSIS — E209 Hypoparathyroidism, unspecified: Secondary | ICD-10-CM | POA: Diagnosis not present

## 2019-01-20 DIAGNOSIS — E890 Postprocedural hypothyroidism: Secondary | ICD-10-CM

## 2019-01-20 NOTE — Patient Instructions (Addendum)
blood tests are requested for you today.  We'll let you know about the results. Please come back for a follow-up appointment in 1 year.   

## 2019-01-20 NOTE — Progress Notes (Signed)
Subjective:    Patient ID: Vickie Valdez, female    DOB: 1956/02/27, 63 y.o.   MRN: [id number]  HPI Pt returns for f/u of stage-1 PTC-FV  6/09: thyroidectomy for 10 mm papillary ca, follicular variant (margins clear, T1 N0 M0).    8/09: RAI 104 mci 8/09: post-therapy scan pos only at the neck.   1/10:  tg undetectable (ab neg) 1/10: neg thyrogen scan 5/10: tg undetectable (ab neg) 11/10: tg undetectable (ab neg) 5/11:  tg undetectable (ab neg) 5/13: tg undetectable (ab neg) 5/14: tg undetectable (ab neg) 5/15: tg undetectable (ab neg) 5/15: tg=0.1 (ab neg).  5/18: tg=0.2 (ab neg).   12/18: tg=0.2 (ab neg). 9/19: tg=0.2 (ab neg). pt says she does not notice any nodule at the neck.  postsurgical hypothyroidism:  she takes synthroid as rx'ed.   Vit-D deficiency: she takes 2000 units qd.   She reports moderate pain rad from the left hip to the left foot.  No assoc numbness. She is seeing specialist for this.   Past Medical History:  Diagnosis Date  . Allergic rhinitis   . GERD 11/08/2007  . 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 HYPOTHYROIDISM, POSTSURGICAL 02/20/2008  . IBS (irritable bowel syndrome)   . Multinodular goiter   . Obesity   . Other dysphagia 11/09/2007  . THYROID CANCER 02/20/2008   stage 1 papillary adenocarcinoma, follicular variant 03/55: thyroidectomy for 4m papillary ca, follicular variant 097/41 104 mci I-131 rx , post-therapy scan pos only at the neck 1/10: tg undectectable (ab neg) neg thyrogen scan 05/10: tg undetectable (ab neg) 11/10: tg undetectable (ab neg) 5/11: tg undectectable (ab neg)    Past Surgical History:  Procedure Laterality Date  . BIOPSY THYROID  10/14/2007  . Thyroid Ultrasound  08/12/2007  . Thyroidecctomy      Social History   Socioeconomic History  . Marital status: Married    Spouse name: Not on file  . Number of children: Not on file  . Years of education: Not on file  . Highest education level: Not on file   Occupational History  . Occupation: AScientist, physiological    Comment: [employer] . Financial resource strain: Not on file  . Food insecurity    Worry: Not on file    Inability: Not on file  . Transportation needs    Medical: Not on file    Non-medical: Not on file  Tobacco Use  . Smoking status: Never Smoker  . Smokeless tobacco: Never Used  Substance and Sexual Activity  . Alcohol use: No  . Drug use: No  . Sexual activity: Not on file  Lifestyle  . Physical activity    Days per week: Not on file    Minutes per session: Not on file  . Stress: Not on file  Relationships  . Social c[place]baliston phone: Not on file    Gets together: Not on file    Attends religious service: Not on file    Active member of club or organization: Not on file    Attends meetings of clubs or organizations: Not on file    Relationship status: Not on file  . Intimate partner violence    Fear of current or ex partner: Not on file    Emotionally abused: Not on file    Physically abused: Not on file    Forced sexual activity: Not on file  Other Topics Concern  . Not on file  Social History Narrative   Regular exercise-yes    Current Outpatient Medications on File Prior to Visit  Medication Sig Dispense Refill  . busPIRone (BUSPAR) 7.5 MG tablet Take 7.5 mg by mouth 2 (two) times daily.     . Calcium Carb-Cholecalciferol (CALCIUM 600 + D PO) Take 1 tablet by mouth daily.    . clidinium-chlordiazePOXIDE (LIBRAX) 2.5-5 MG per capsule Take 1 capsule by mouth 4 (four) times daily as needed.      . diclofenac (VOLTAREN) 75 MG EC tablet Take 1 tablet (75 mg total) by mouth 2 (two) times daily. 60 tablet 1  . estradiol (ESTRACE) 0.1 MG/GM vaginal cream Place 1 Applicatorful vaginally 3 (three) times a week.    . hyoscyamine (LEVSIN, ANASPAZ) 0.125 MG tablet     . levothyroxine (SYNTHROID, LEVOTHROID) 100 MCG tablet Take 1 tablet (100 mcg total) by mouth daily before breakfast.  90 tablet 0  . Multiple Vitamin (MULTIVITAMIN) tablet Take 1 tablet by mouth daily.      Marland Kitchen omeprazole (PRILOSEC) 20 MG capsule Take 20 mg by mouth daily.      No current facility-administered medications on file prior to visit.     Allergies  Allergen Reactions  . Aspirin     Upset GI System  . Prednisone     Per pt "makes her feel strange"  . Penicillins Rash    Family History  Problem Relation Age of Onset  . Thyroid disease Sister        Hyperthyroidism    BP 116/82 (BP Location: Right Arm, Patient Position: Sitting, Cuff Size: Normal)   Pulse 62   Temp 98.4 F (36.9 C) (Oral)   Wt 166 lb 9.6 oz (75.6 kg)   SpO2 98%   BMI 29.51 kg/m    Review of Systems Denies muscle cramps and neck pain.    Objective:   Physical Exam VITAL SIGNS:  See vs page GENERAL: no distress Neck: a healed scar is present.  I do not appreciate a nodule in the thyroid or elsewhere in the neck      Assessment & Plan:  PTC-FV: recheck today.  Hypothyroidism: due for recheck.  LLE pain, prob radicular: I told pt this is prob not thyroid-related.  Vit-D def: recheck today  Patient Instructions  blood tests are requested for you today.  We'll let you know about the results.  Please come back for a follow-up appointment in 1 year.

## 2019-01-23 LAB — VITAMIN D 25 HYDROXY (VIT D DEFICIENCY, FRACTURES): Vit D, 25-Hydroxy: 47 ng/mL (ref 30–100)

## 2019-01-23 LAB — T4, FREE: Free T4: 1.4 ng/dL (ref 0.8–1.8)

## 2019-01-23 LAB — PTH, INTACT AND CALCIUM
Calcium: 8.9 mg/dL (ref 8.6–10.4)
PTH: 21 pg/mL (ref 14–64)

## 2019-01-23 LAB — THYROGLOBULIN LEVEL: Thyroglobulin: 0.2 ng/mL — ABNORMAL LOW

## 2019-01-23 LAB — THYROGLOBULIN ANTIBODY: Thyroglobulin Ab: <1 IU/mL (ref <=1)

## 2019-01-23 LAB — TSH: TSH: 0.11 mIU/L — ABNORMAL LOW (ref 0.40–4.50)

## 2019-02-02 ENCOUNTER — Other Ambulatory Visit: Payer: Self-pay | Admitting: Endocrinology

## 2019-04-28 ENCOUNTER — Ambulatory Visit: Payer: 59 | Admitting: Endocrinology

## 2019-05-09 ENCOUNTER — Other Ambulatory Visit: Payer: Self-pay | Admitting: Endocrinology

## 2019-05-11 ENCOUNTER — Ambulatory Visit: Payer: BC Managed Care – PPO | Admitting: Podiatry

## 2019-05-19 ENCOUNTER — Encounter: Payer: Self-pay | Admitting: Podiatry

## 2019-05-19 ENCOUNTER — Ambulatory Visit: Payer: BC Managed Care – PPO

## 2019-05-19 ENCOUNTER — Ambulatory Visit (INDEPENDENT_AMBULATORY_CARE_PROVIDER_SITE_OTHER): Payer: BC Managed Care – PPO | Admitting: Podiatry

## 2019-05-19 ENCOUNTER — Other Ambulatory Visit: Payer: Self-pay

## 2019-05-19 DIAGNOSIS — M79672 Pain in left foot: Secondary | ICD-10-CM | POA: Diagnosis not present

## 2019-05-19 DIAGNOSIS — M79671 Pain in right foot: Secondary | ICD-10-CM | POA: Diagnosis not present

## 2019-05-19 DIAGNOSIS — Q828 Other specified congenital malformations of skin: Secondary | ICD-10-CM

## 2019-05-19 NOTE — Progress Notes (Signed)
d 

## 2019-05-21 NOTE — Progress Notes (Signed)
Subjective:   Patient ID: Vickie Valdez, female   DOB: 63 y.o.   MRN: [id number]   HPI Patient states she is had chronic lesions on the bottom of both feet and has not had a treated for a number of years.  States it is gotten worse over the last few months.  Patient does not smoke likes to be active   Review of Systems  All other systems reviewed and are negative.       Objective:  Physical Exam Vitals signs and nursing note reviewed.  Constitutional:      Appearance: She is well-developed.  Pulmonary:     Effort: Pulmonary effort is normal.  Musculoskeletal: Normal range of motion.  Skin:    General: Skin is warm.  Neurological:     Mental Status: She is alert.     Neurovascular status intact muscle strength found to be adequate with patient noted to have significant keratotic lesions plantar aspect bilateral fifth metatarsal that are painful when pressed and making shoe gear difficult.  Patient is trying to walk comfortably but is having difficulty with this and was found to have good digital perfusion     Assessment:  Chronic lesion formation bilateral that are painful     Plan:  H&P reviewed condition debrided lesions bilateral iatrogenic bleeding discussed supportive shoes and will reappoint to be seen back  X-rays dated today did indicate that there is moderate depression of the arch with no indications of stress fracture or arthritic process associated with her pain

## 2019-05-23 DIAGNOSIS — Z6830 Body mass index (BMI) 30.0-30.9, adult: Secondary | ICD-10-CM | POA: Diagnosis not present

## 2019-05-23 DIAGNOSIS — E7800 Pure hypercholesterolemia, unspecified: Secondary | ICD-10-CM | POA: Diagnosis not present

## 2019-06-28 ENCOUNTER — Encounter: Payer: Self-pay | Admitting: Podiatry

## 2019-06-28 ENCOUNTER — Other Ambulatory Visit: Payer: Self-pay

## 2019-06-28 ENCOUNTER — Ambulatory Visit (INDEPENDENT_AMBULATORY_CARE_PROVIDER_SITE_OTHER): Payer: BC Managed Care – PPO

## 2019-06-28 ENCOUNTER — Ambulatory Visit: Payer: BC Managed Care – PPO | Admitting: Podiatry

## 2019-06-28 ENCOUNTER — Other Ambulatory Visit: Payer: Self-pay | Admitting: Podiatry

## 2019-06-28 DIAGNOSIS — M7672 Peroneal tendinitis, left leg: Secondary | ICD-10-CM

## 2019-06-28 DIAGNOSIS — M79672 Pain in left foot: Secondary | ICD-10-CM

## 2019-06-29 NOTE — Progress Notes (Signed)
Subjective:   Patient ID: Vickie Valdez, female   DOB: 63 y.o.   MRN: [id number]   HPI Patient states she is been very active and is also started jumping jacks and is developed a lot of pain in the outside of her left ankle and states she probably should not have done all these activities   ROS      Objective:  Physical Exam  Neurovascular status intact with patient found to have quite a bit of inflammation around the lateral ankle gutter and peroneal tendon with no indication of tendon dysfunction with inflammation and slight swelling around the area     Assessment:  Inflammatory peroneal tendinitis left     Plan:  H&P reviewed condition and did sterile prep and injected the peroneal tendon complex 3 mg Kenalog 5 mg Xylocaine advised on ice therapy anti-inflammatories and reappoint to recheck  X-rays were negative for signs of fracture or other bone contusion

## 2019-06-30 ENCOUNTER — Ambulatory Visit: Payer: BC Managed Care – PPO | Admitting: Podiatry

## 2019-08-07 ENCOUNTER — Other Ambulatory Visit: Payer: Self-pay | Admitting: Endocrinology

## 2019-09-03 ENCOUNTER — Ambulatory Visit: Payer: Self-pay | Attending: Internal Medicine

## 2019-09-03 DIAGNOSIS — Z23 Encounter for immunization: Secondary | ICD-10-CM | POA: Insufficient documentation

## 2019-09-04 NOTE — Progress Notes (Signed)
   Covid-19 Vaccination Clinic  Name:  Vickie Valdez    MRN: [id number] DOB: [birth year]  09/03/2019  Vickie Valdez was observed post Covid-19 immunization for 15 minutes without incidence. She was provided with Vaccine Information Sheet and instruction to access the V-Safe system.   Vickie Valdez was instructed to call 911 with any severe reactions post vaccine: Marland Kitchen Difficulty breathing  . Swelling of your face and throat  . A fast heartbeat  . A bad rash all over your body  . Dizziness and weakness    Immunizations Administered    Name Date Dose VIS Date Route   Moderna COVID-19 Vaccine 09/03/2019  4:50 PM 0.5 mL 07/11/2019 Intramuscular   Manufacturer: Levan Hurst   Lot: [id number]   [id number]      Documented on behalf of: C. Jerline Pain

## 2019-09-08 DIAGNOSIS — Z1231 Encounter for screening mammogram for malignant neoplasm of breast: Secondary | ICD-10-CM | POA: Diagnosis not present

## 2019-09-19 DIAGNOSIS — L820 Inflamed seborrheic keratosis: Secondary | ICD-10-CM | POA: Diagnosis not present

## 2019-10-01 ENCOUNTER — Ambulatory Visit: Payer: Self-pay | Attending: Internal Medicine

## 2019-10-01 DIAGNOSIS — Z23 Encounter for immunization: Secondary | ICD-10-CM

## 2019-10-01 NOTE — Progress Notes (Signed)
   Covid-19 Vaccination Clinic  Name:  Vickie Valdez    MRN: [id number] DOB: 1955/12/13  10/01/2019  [name] was observed post Covid-19 immunization for 15 minutes without incidence. She was provided with Vaccine Information Sheet and instruction to access the V-Safe system.   [name] was instructed to call 911 with any severe reactions post vaccine: Marland Kitchen Difficulty breathing  . Swelling of your face and throat  . A fast heartbeat  . A bad rash all over your body  . Dizziness and weakness    Immunizations Administered    Name Date Dose VIS Date Route   Moderna COVID-19 Vaccine 10/01/2019  3:26 PM 0.5 mL 07/11/2019 Intramuscular   Manufacturer: Moderna   Lot: [id number]   [id number]

## 2019-10-09 DIAGNOSIS — K219 Gastro-esophageal reflux disease without esophagitis: Secondary | ICD-10-CM | POA: Diagnosis not present

## 2019-11-06 ENCOUNTER — Other Ambulatory Visit: Payer: Self-pay | Admitting: Endocrinology

## 2019-11-14 DIAGNOSIS — R12 Heartburn: Secondary | ICD-10-CM | POA: Diagnosis not present

## 2019-12-05 DIAGNOSIS — E7800 Pure hypercholesterolemia, unspecified: Secondary | ICD-10-CM | POA: Diagnosis not present

## 2019-12-05 DIAGNOSIS — Z6830 Body mass index (BMI) 30.0-30.9, adult: Secondary | ICD-10-CM | POA: Diagnosis not present

## 2020-01-19 ENCOUNTER — Ambulatory Visit: Payer: BC Managed Care – PPO | Admitting: Endocrinology

## 2020-02-02 ENCOUNTER — Ambulatory Visit (INDEPENDENT_AMBULATORY_CARE_PROVIDER_SITE_OTHER): Payer: BC Managed Care – PPO | Admitting: Endocrinology

## 2020-02-02 ENCOUNTER — Other Ambulatory Visit: Payer: Self-pay

## 2020-02-02 ENCOUNTER — Encounter: Payer: Self-pay | Admitting: Endocrinology

## 2020-02-02 VITALS — BP 132/80 | HR 81 | Ht 63.0 in | Wt 172.8 lb

## 2020-02-02 DIAGNOSIS — C73 Malignant neoplasm of thyroid gland: Secondary | ICD-10-CM | POA: Diagnosis not present

## 2020-02-02 DIAGNOSIS — E209 Hypoparathyroidism, unspecified: Secondary | ICD-10-CM

## 2020-02-02 DIAGNOSIS — E890 Postprocedural hypothyroidism: Secondary | ICD-10-CM | POA: Diagnosis not present

## 2020-02-02 NOTE — Progress Notes (Signed)
Subjective:    Patient ID: Vickie Valdez, female    DOB: February 09, 1956, 64 y.o.   MRN: [id number]  HPI Pt returns for f/u of stage-1 PTC-FV  6/09: thyroidectomy for 10 mm papillary ca, follicular variant (margins clear, T1 N0 M0).    8/09: RAI 104 mci 8/09: post-therapy scan pos only at the neck.   1/10:  tg undetectable (ab neg) 1/10: neg thyrogen scan 5/10: tg undetectable (ab neg) 11/10: tg undetectable (ab neg) 5/11:  tg undetectable (ab neg) 5/13: tg undetectable (ab neg) 5/14: tg undetectable (ab neg) 5/15: tg undetectable (ab neg) 5/15: tg=0.1 (ab neg).  5/18: tg=0.2 (ab neg).   12/18: tg=0.2 (ab neg). 9/19: tg=0.2 (ab neg). 9/19: tg=0.2 (ab neg) pt says she does not notice any nodule at the neck.  postsurgical hypothyroidism:  she takes synthroid as rx'ed.  due to persistent detectable TG, goal TSH is borderline low or slightly suppressed.  Vit-D deficiency: she takes 2000 units qd.   Past Medical History:  Diagnosis Date  . Allergic rhinitis   . GERD 11/08/2007  . 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 HYPOTHYROIDISM, POSTSURGICAL 02/20/2008  . IBS (irritable bowel syndrome)   . Multinodular goiter   . Obesity   . Other dysphagia 11/09/2007  . THYROID CANCER 02/20/2008   stage 1 papillary adenocarcinoma, follicular variant 72/53: thyroidectomy for 56m papillary ca, follicular variant 066/44 104 mci I-131 rx , post-therapy scan pos only at the neck 1/10: tg undectectable (ab neg) neg thyrogen scan 05/10: tg undetectable (ab neg) 11/10: tg undetectable (ab neg) 5/11: tg undectectable (ab neg)    Past Surgical History:  Procedure Laterality Date  . BIOPSY THYROID  10/14/2007  . Thyroid Ultrasound  08/12/2007  . Thyroidecctomy      Social History   Socioeconomic History  . Marital status: Married    Spouse name: Not on file  . Number of children: Not on file  . Years of education: Not on file  . Highest education level: Not on file  Occupational History  .  Occupation: AScientist, physiological    Comment: [employer]  Tobacco Use  . Smoking status: Never Smoker  . Smokeless tobacco: Never Used  Substance and Sexual Activity  . Alcohol use: No  . Drug use: No  . Sexual activity: Not on file  Other Topics Concern  . Not on file  Social History Narrative   Regular exercise-yes   Social Determinants of Health   Financial Resource Strain:   . Difficulty of Paying Living Expenses:   Food Insecurity:   . Worried About RCharity fundraiserin the Last Year:   . RArboriculturistin the Last Year:   Transportation Needs:   . LFilm/video editor(Medical):   .Marland KitchenLack of Transportation (Non-Medical):   Physical Activity:   . Days of Exercise per Week:   . Minutes of Exercise per Session:   Stress:   . Feeling of Stress :   Social Connections:   . Frequency of Communication with Friends and Family:   . Frequency of Social Gatherings with Friends and Family:   . Attends Religious Services:   . Active Member of Clubs or Organizations:   . Attends CArchivistMeetings:   .Marland KitchenMarital Status:   Intimate Partner Violence:   . Fear of Current or Ex-Partner:   . Emotionally Abused:   .Marland KitchenPhysically Abused:   . Sexually Abused:     Current Outpatient Medications on  File Prior to Visit  Medication Sig Dispense Refill  . busPIRone (BUSPAR) 7.5 MG tablet Take 7.5 mg by mouth 2 (two) times daily.     . Calcium Carb-Cholecalciferol (CALCIUM 600 + D PO) Take 1 tablet by mouth daily.    . clidinium-chlordiazePOXIDE (LIBRAX) 2.5-5 MG per capsule Take 1 capsule by mouth 4 (four) times daily as needed.      . diclofenac (VOLTAREN) 75 MG EC tablet Take 1 tablet (75 mg total) by mouth 2 (two) times daily. 60 tablet 1  . estradiol (ESTRACE) 0.1 MG/GM vaginal cream Place 1 Applicatorful vaginally 3 (three) times a week.    . gabapentin (NEURONTIN) 300 MG capsule gabapentin 300 mg capsule  TAKE 1 CAPSULE BY MOUTH EVERYDAY AT BEDTIME    . hyoscyamine  (LEVSIN, ANASPAZ) 0.125 MG tablet     . levothyroxine (SYNTHROID) 100 MCG tablet TAKE 1 TABLET BY MOUTH EVERY DAY BEFORE BREAKFAST. NEED OFFICE VISIT FOR FURTHER REFILLS 90 tablet 0  . Multiple Vitamin (MULTIVITAMIN) tablet Take 1 tablet by mouth daily.      Marland Kitchen omeprazole (PRILOSEC) 20 MG capsule Take 20 mg by mouth daily.      No current facility-administered medications on file prior to visit.    Allergies  Allergen Reactions  . Aspirin     Upset GI System  . Prednisone     Per pt "makes her feel strange"  . Penicillins Rash    Family History  Problem Relation Age of Onset  . Thyroid disease Sister        Hyperthyroidism    BP 132/80   Pulse 81   Ht '5\' 3"'  (1.6 m)   Wt 172 lb 12.8 oz (78.4 kg)   SpO2 97%   BMI 30.61 kg/m    Review of Systems Denies neck pain    Objective:   Physical Exam VITAL SIGNS:  See vs page GENERAL: no distress Neck: a healed scar is present.  I do not appreciate a nodule in the thyroid or elsewhere in the neck.   Lab Results  Component Value Date   TSH 0.43 02/02/2020      Assessment & Plan:  Hypothyroidism: well-replaced for this situation.  Please continue the same medication Vit-D def: recheck today.  PTC: recheck today.  Patient Instructions  blood tests are requested for you today.  We'll let you know about the results.  Please come back for a follow-up appointment in 1 year.

## 2020-02-02 NOTE — Patient Instructions (Signed)
blood tests are requested for you today.  We'll let you know about the results. Please come back for a follow-up appointment in 1 year.   

## 2020-02-05 LAB — THYROGLOBULIN LEVEL: Thyroglobulin: 0.2 ng/mL — ABNORMAL LOW

## 2020-02-05 LAB — PTH, INTACT AND CALCIUM
Calcium: 9.1 mg/dL (ref 8.6–10.4)
PTH: 20 pg/mL (ref 14–64)

## 2020-02-05 LAB — T4, FREE: Free T4: 1.5 ng/dL (ref 0.8–1.8)

## 2020-02-05 LAB — THYROGLOBULIN ANTIBODY: Thyroglobulin Ab: <1 IU/mL (ref <=1)

## 2020-02-05 LAB — VITAMIN D 25 HYDROXY (VIT D DEFICIENCY, FRACTURES): Vit D, 25-Hydroxy: 23 ng/mL — ABNORMAL LOW (ref 30–100)

## 2020-02-05 LAB — TSH: TSH: 0.43 mIU/L (ref 0.40–4.50)

## 2020-02-06 ENCOUNTER — Telehealth: Payer: Self-pay

## 2020-02-06 NOTE — Telephone Encounter (Signed)
RESULTS  Results were reviewed by Dr. Ellison. A letter has been mailed to pt home address. For future reference, letter can be found in Epic. 

## 2020-02-06 NOTE — Telephone Encounter (Signed)
-----   Message from Renato Shin, MD sent at 02/05/2020  6:48 PM EDT ----- please contact patient: Cancer test is again at a really low level--good.  Vitamin-D is just a little low.  Please continue the same medications.  Please come back for a follow-up appointment in 1 year.

## 2020-02-08 ENCOUNTER — Telehealth: Payer: Self-pay | Admitting: Endocrinology

## 2020-02-08 NOTE — Telephone Encounter (Signed)
Patient requests to be called at ph# [phone] to be given lab results

## 2020-02-08 NOTE — Telephone Encounter (Signed)
As stated in a previously created encounter, letter has been mailed. No further action required

## 2020-02-17 ENCOUNTER — Other Ambulatory Visit: Payer: Self-pay | Admitting: Endocrinology

## 2020-02-23 NOTE — Telephone Encounter (Signed)
At the amount you take, it is non-prescription.

## 2020-02-23 NOTE — Telephone Encounter (Signed)
Patient notified of results and answered her questions.    Patient did want to ask if you think she need RX for vitamin D?  She is currently taking over the counter Vitamin D.

## 2020-02-23 NOTE — Telephone Encounter (Signed)
Patient requests to be called at ph# [phone] to go over lab results patient received in the mail. Patient has questions.

## 2020-02-26 NOTE — Telephone Encounter (Signed)
Called pt and gave her MD message. Pt verbalized understanding. 

## 2020-03-20 ENCOUNTER — Other Ambulatory Visit (HOSPITAL_COMMUNITY)
Admission: RE | Admit: 2020-03-20 | Discharge: 2020-03-20 | Disposition: A | Discharge status: Home / Self Care | Payer: BC Managed Care – PPO | Source: Ambulatory Visit | Attending: Family Medicine | Admitting: Family Medicine

## 2020-03-20 DIAGNOSIS — E7800 Pure hypercholesterolemia, unspecified: Secondary | ICD-10-CM | POA: Diagnosis not present

## 2020-03-20 DIAGNOSIS — M79605 Pain in left leg: Secondary | ICD-10-CM | POA: Diagnosis not present

## 2020-03-20 DIAGNOSIS — R5383 Other fatigue: Secondary | ICD-10-CM | POA: Diagnosis not present

## 2020-03-20 DIAGNOSIS — Z124 Encounter for screening for malignant neoplasm of cervix: Secondary | ICD-10-CM | POA: Insufficient documentation

## 2020-03-20 DIAGNOSIS — Z23 Encounter for immunization: Secondary | ICD-10-CM | POA: Diagnosis not present

## 2020-03-20 DIAGNOSIS — Z0000 Encounter for general adult medical examination without abnormal findings: Secondary | ICD-10-CM | POA: Diagnosis not present

## 2020-03-21 LAB — CYTOLOGY - PAP: Diagnosis: NEGATIVE

## 2020-04-11 DIAGNOSIS — Z6830 Body mass index (BMI) 30.0-30.9, adult: Secondary | ICD-10-CM | POA: Diagnosis not present

## 2020-04-11 DIAGNOSIS — E559 Vitamin D deficiency, unspecified: Secondary | ICD-10-CM | POA: Diagnosis not present

## 2020-04-19 DIAGNOSIS — E039 Hypothyroidism, unspecified: Secondary | ICD-10-CM | POA: Diagnosis not present

## 2020-04-19 DIAGNOSIS — Z6830 Body mass index (BMI) 30.0-30.9, adult: Secondary | ICD-10-CM | POA: Diagnosis not present

## 2020-04-19 DIAGNOSIS — N951 Menopausal and female climacteric states: Secondary | ICD-10-CM | POA: Diagnosis not present

## 2020-04-19 DIAGNOSIS — E7800 Pure hypercholesterolemia, unspecified: Secondary | ICD-10-CM | POA: Diagnosis not present

## 2020-04-19 DIAGNOSIS — E559 Vitamin D deficiency, unspecified: Secondary | ICD-10-CM | POA: Diagnosis not present

## 2020-04-29 DIAGNOSIS — Z6830 Body mass index (BMI) 30.0-30.9, adult: Secondary | ICD-10-CM | POA: Diagnosis not present

## 2020-04-29 DIAGNOSIS — E039 Hypothyroidism, unspecified: Secondary | ICD-10-CM | POA: Diagnosis not present

## 2020-04-29 DIAGNOSIS — E7800 Pure hypercholesterolemia, unspecified: Secondary | ICD-10-CM | POA: Diagnosis not present

## 2020-04-29 DIAGNOSIS — E611 Iron deficiency: Secondary | ICD-10-CM | POA: Diagnosis not present

## 2020-05-10 ENCOUNTER — Other Ambulatory Visit: Payer: Self-pay | Admitting: Endocrinology

## 2020-05-13 DIAGNOSIS — E7800 Pure hypercholesterolemia, unspecified: Secondary | ICD-10-CM | POA: Diagnosis not present

## 2020-05-13 DIAGNOSIS — Z6830 Body mass index (BMI) 30.0-30.9, adult: Secondary | ICD-10-CM | POA: Diagnosis not present

## 2020-05-24 DIAGNOSIS — Z23 Encounter for immunization: Secondary | ICD-10-CM | POA: Diagnosis not present

## 2020-05-28 DIAGNOSIS — L304 Erythema intertrigo: Secondary | ICD-10-CM | POA: Diagnosis not present

## 2020-05-28 DIAGNOSIS — R234 Changes in skin texture: Secondary | ICD-10-CM | POA: Diagnosis not present

## 2020-06-14 ENCOUNTER — Ambulatory Visit (INDEPENDENT_AMBULATORY_CARE_PROVIDER_SITE_OTHER): Payer: BC Managed Care – PPO

## 2020-06-14 ENCOUNTER — Encounter: Payer: Self-pay | Admitting: Podiatry

## 2020-06-14 ENCOUNTER — Ambulatory Visit (INDEPENDENT_AMBULATORY_CARE_PROVIDER_SITE_OTHER): Payer: BC Managed Care – PPO | Admitting: Podiatry

## 2020-06-14 ENCOUNTER — Other Ambulatory Visit: Payer: Self-pay

## 2020-06-14 DIAGNOSIS — Q828 Other specified congenital malformations of skin: Secondary | ICD-10-CM | POA: Diagnosis not present

## 2020-06-14 DIAGNOSIS — S9031XA Contusion of right foot, initial encounter: Secondary | ICD-10-CM

## 2020-06-14 DIAGNOSIS — S90121A Contusion of right lesser toe(s) without damage to nail, initial encounter: Secondary | ICD-10-CM | POA: Diagnosis not present

## 2020-06-15 NOTE — Progress Notes (Signed)
Subjective:   Patient ID: Vickie Valdez, female   DOB: 64 y.o.   MRN: [id number]   HPI Patient presents stating she stubbed her fourth toe right foot and its been swollen and making it hard to walk.  She may have broken it and it is becoming more difficult to wear shoe gear comfortably and also she has lesions on both feet that are bothersome   ROS      Objective:  Physical Exam  Neurovascular status intact with patient found to have a swollen fourth digit right foot at the distal to phalangeal joint and has multiple lesions bilateral foot plantar     Assessment:  Probability for fracture fourth digit right versus contusion with lesion formation consistent with porokeratosis bilateral     Plan:  H&P reviewed both conditions and discussed.  For the toe I recommended wider shoe gear and buttress padding along with buddy taping.  For the lesions I debrided them which can be done periodically  X-rays indicate that there is been stress to the distal phalanx right fourth toe with probability for a nondisplaced fracture

## 2020-06-19 DIAGNOSIS — T50B95A Adverse effect of other viral vaccines, initial encounter: Secondary | ICD-10-CM | POA: Diagnosis not present

## 2020-08-23 DIAGNOSIS — Z6830 Body mass index (BMI) 30.0-30.9, adult: Secondary | ICD-10-CM | POA: Diagnosis not present

## 2020-08-23 DIAGNOSIS — E039 Hypothyroidism, unspecified: Secondary | ICD-10-CM | POA: Diagnosis not present

## 2020-09-09 DIAGNOSIS — H52203 Unspecified astigmatism, bilateral: Secondary | ICD-10-CM | POA: Diagnosis not present

## 2020-09-09 DIAGNOSIS — H5213 Myopia, bilateral: Secondary | ICD-10-CM | POA: Diagnosis not present

## 2020-09-09 DIAGNOSIS — H25013 Cortical age-related cataract, bilateral: Secondary | ICD-10-CM | POA: Diagnosis not present

## 2020-09-09 DIAGNOSIS — H524 Presbyopia: Secondary | ICD-10-CM | POA: Diagnosis not present

## 2020-09-09 DIAGNOSIS — H2513 Age-related nuclear cataract, bilateral: Secondary | ICD-10-CM | POA: Diagnosis not present

## 2020-09-13 DIAGNOSIS — Z803 Family history of malignant neoplasm of breast: Secondary | ICD-10-CM | POA: Diagnosis not present

## 2020-09-13 DIAGNOSIS — Z1231 Encounter for screening mammogram for malignant neoplasm of breast: Secondary | ICD-10-CM | POA: Diagnosis not present

## 2020-10-15 ENCOUNTER — Other Ambulatory Visit: Payer: Self-pay

## 2020-10-17 ENCOUNTER — Ambulatory Visit: Payer: BC Managed Care – PPO | Admitting: Endocrinology

## 2020-10-18 ENCOUNTER — Ambulatory Visit: Payer: BC Managed Care – PPO | Admitting: Endocrinology

## 2020-10-21 ENCOUNTER — Ambulatory Visit: Payer: BC Managed Care – PPO | Admitting: Endocrinology

## 2020-10-22 ENCOUNTER — Ambulatory Visit: Payer: BC Managed Care – PPO | Admitting: Endocrinology

## 2020-10-22 ENCOUNTER — Other Ambulatory Visit: Payer: Self-pay

## 2020-10-22 VITALS — BP 120/74 | HR 79 | Ht 63.0 in | Wt 174.2 lb

## 2020-10-22 DIAGNOSIS — E209 Hypoparathyroidism, unspecified: Secondary | ICD-10-CM

## 2020-10-22 DIAGNOSIS — C73 Malignant neoplasm of thyroid gland: Secondary | ICD-10-CM | POA: Diagnosis not present

## 2020-10-22 DIAGNOSIS — E890 Postprocedural hypothyroidism: Secondary | ICD-10-CM | POA: Diagnosis not present

## 2020-10-22 NOTE — Patient Instructions (Signed)
blood tests are requested for you today.  We'll let you know about the results. Please come back for a follow-up appointment in 1 year.   

## 2020-10-22 NOTE — Progress Notes (Signed)
Subjective:    Patient ID: Vickie Valdez, female    DOB: 06-25-1956, 65 y.o.   MRN: [id number]  HPI Pt returns for f/u of stage-1 PTC-FV  6/09: thyroidectomy for 10 mm papillary ca, follicular variant (margins clear, T1 N0 M0).    8/09: RAI 104 mci 8/09: post-therapy scan pos only at the neck.   1/10:  tg undetectable (ab neg) 1/10: neg thyrogen scan 5/10: tg undetectable (ab neg) 11/10: tg undetectable (ab neg) 5/11:  tg undetectable (ab neg) 5/13: tg undetectable (ab neg) 5/14: tg undetectable (ab neg) 5/15: tg undetectable (ab neg) 5/15: tg=0.1 (ab neg).  5/18: tg=0.2 (ab neg).   12/18: tg=0.2 (ab neg). 9/19: tg=0.2 (ab neg). 9/19: tg=0.2 (ab neg) 6/21: tg=0.2 (ab neg) pt says she does not notice any nodule at the neck.  postsurgical hypothyroidism:  she takes synthroid as rx'ed.  due to persistent detectable TG, goal TSH is borderline low or slightly suppressed. Main symptom is fatigue.  Vit-D deficiency: she takes 1000 units qd.   Past Medical History:  Diagnosis Date  . Allergic rhinitis   . GERD 11/08/2007  . 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 HYPOTHYROIDISM, POSTSURGICAL 02/20/2008  . IBS (irritable bowel syndrome)   . Multinodular goiter   . Obesity   . Other dysphagia 11/09/2007  . THYROID CANCER 02/20/2008   stage 1 papillary adenocarcinoma, follicular variant 56/31: thyroidectomy for 61m papillary ca, follicular variant 049/70 104 mci I-131 rx , post-therapy scan pos only at the neck 1/10: tg undectectable (ab neg) neg thyrogen scan 05/10: tg undetectable (ab neg) 11/10: tg undetectable (ab neg) 5/11: tg undectectable (ab neg)    Past Surgical History:  Procedure Laterality Date  . BIOPSY THYROID  10/14/2007  . Thyroid Ultrasound  08/12/2007  . Thyroidecctomy      Social History   Socioeconomic History  . Marital status: Married    Spouse name: Not on file  . Number of children: Not on file  . Years of education: Not on file  . Highest  education level: Not on file  Occupational History  . Occupation: AScientist, physiological    Comment: [employer]  Tobacco Use  . Smoking status: Never Smoker  . Smokeless tobacco: Never Used  Substance and Sexual Activity  . Alcohol use: No  . Drug use: No  . Sexual activity: Not on file  Other Topics Concern  . Not on file  Social History Narrative   Regular exercise-yes   Social Determinants of Health   Financial Resource Strain: Not on file  Food Insecurity: Not on file  Transportation Needs: Not on file  Physical Activity: Not on file  Stress: Not on file  Social Connections: Not on file  Intimate Partner Violence: Not on file    Current Outpatient Medications on File Prior to Visit  Medication Sig Dispense Refill  . busPIRone (BUSPAR) 7.5 MG tablet Take 7.5 mg by mouth 2 (two) times daily.     . Calcium Carb-Cholecalciferol (CALCIUM 600 + D PO) Take 1 tablet by mouth daily.    . clidinium-chlordiazePOXIDE (LIBRAX) 2.5-5 MG per capsule Take 1 capsule by mouth 4 (four) times daily as needed.    . diclofenac (VOLTAREN) 75 MG EC tablet Take 1 tablet (75 mg total) by mouth 2 (two) times daily. 60 tablet 1  . estradiol (ESTRACE) 0.1 MG/GM vaginal cream Place 1 Applicatorful vaginally 3 (three) times a week.    . gabapentin (NEURONTIN) 300 MG capsule gabapentin 300 mg capsule  TAKE 1 CAPSULE BY MOUTH EVERYDAY AT BEDTIME    . hyoscyamine (LEVSIN, ANASPAZ) 0.125 MG tablet     . levothyroxine (SYNTHROID) 100 MCG tablet Take 1 tablet (100 mcg total) by mouth daily before breakfast. 90 tablet 3  . Multiple Vitamin (MULTIVITAMIN) tablet Take 1 tablet by mouth daily.    Marland Kitchen omeprazole (PRILOSEC) 20 MG capsule Take 20 mg by mouth daily.      No current facility-administered medications on file prior to visit.    Allergies  Allergen Reactions  . Aspirin     Upset GI System  . Prednisone     Per pt "makes her feel strange"  . Penicillins Rash    Family History  Problem Relation  Age of Onset  . Thyroid disease Sister        Hyperthyroidism    BP 120/74 (BP Location: Right Arm, Patient Position: Sitting, Cuff Size: Large)   Pulse 79   Ht '5\' 3"'  (1.6 m)   Wt 174 lb 3.2 oz (79 kg)   SpO2 97%   BMI 30.86 kg/m    Review of Systems     Objective:   Physical Exam VITAL SIGNS:  See vs page.  GENERAL: no distress.   Neck: a healed scar is present.  I do not appreciate a nodule in the thyroid or elsewhere in the neck.    Lab Results  Component Value Date   TSH 0.70 10/22/2020       Assessment & Plan:  Hypothyroidism: well-replaced.  PTC: recheck today.

## 2020-10-23 ENCOUNTER — Ambulatory Visit: Payer: BC Managed Care – PPO | Admitting: Endocrinology

## 2020-10-23 LAB — THYROGLOBULIN LEVEL: Thyroglobulin: 0.2 ng/mL — ABNORMAL LOW

## 2020-10-23 LAB — BASIC METABOLIC PANEL
BUN: 14 mg/dL (ref 6–23)
CO2: 29 mEq/L (ref 19–32)
Calcium: 8.9 mg/dL (ref 8.4–10.5)
Chloride: 107 mEq/L (ref 96–112)
Creatinine, Ser: 0.83 mg/dL (ref 0.40–1.20)
GFR: 75.13 mL/min (ref >60.00)
Glucose, Bld: 75 mg/dL (ref 70–99)
Potassium: 3.8 mEq/L (ref 3.5–5.1)
Sodium: 142 mEq/L (ref 135–145)

## 2020-10-23 LAB — PTH, INTACT AND CALCIUM
Calcium: 8.8 mg/dL (ref 8.6–10.4)
PTH: 25 pg/mL (ref 16–77)

## 2020-10-23 LAB — T4, FREE: Free T4: 1.17 ng/dL (ref 0.60–1.60)

## 2020-10-23 LAB — VITAMIN D 25 HYDROXY (VIT D DEFICIENCY, FRACTURES): VITD: 51.75 ng/mL (ref 30.00–100.00)

## 2020-10-23 LAB — TSH: TSH: 0.7 u[IU]/mL (ref 0.35–4.50)

## 2020-10-23 LAB — THYROGLOBULIN ANTIBODY: Thyroglobulin Ab: <1 IU/mL (ref <=1)

## 2020-12-03 DIAGNOSIS — M25512 Pain in left shoulder: Secondary | ICD-10-CM | POA: Diagnosis not present

## 2020-12-11 DIAGNOSIS — Z6829 Body mass index (BMI) 29.0-29.9, adult: Secondary | ICD-10-CM | POA: Diagnosis not present

## 2020-12-11 DIAGNOSIS — E559 Vitamin D deficiency, unspecified: Secondary | ICD-10-CM | POA: Diagnosis not present

## 2021-01-31 ENCOUNTER — Ambulatory Visit: Payer: BC Managed Care – PPO | Admitting: Endocrinology

## 2021-02-03 ENCOUNTER — Ambulatory Visit
Admission: RE | Admit: 2021-02-03 | Discharge: 2021-02-03 | Disposition: A | Discharge status: Home / Self Care | Payer: BC Managed Care – PPO | Source: Ambulatory Visit | Attending: Endocrinology | Admitting: Endocrinology

## 2021-02-03 ENCOUNTER — Other Ambulatory Visit: Payer: Self-pay

## 2021-02-03 ENCOUNTER — Ambulatory Visit: Payer: BC Managed Care – PPO | Admitting: Endocrinology

## 2021-02-03 VITALS — BP 110/70 | HR 70 | Ht 63.0 in | Wt 163.0 lb

## 2021-02-03 DIAGNOSIS — E890 Postprocedural hypothyroidism: Secondary | ICD-10-CM | POA: Diagnosis not present

## 2021-02-03 DIAGNOSIS — Q678 Other congenital deformities of chest: Secondary | ICD-10-CM

## 2021-02-03 DIAGNOSIS — E209 Hypoparathyroidism, unspecified: Secondary | ICD-10-CM

## 2021-02-03 DIAGNOSIS — C73 Malignant neoplasm of thyroid gland: Secondary | ICD-10-CM

## 2021-02-03 DIAGNOSIS — R222 Localized swelling, mass and lump, trunk: Secondary | ICD-10-CM | POA: Diagnosis not present

## 2021-02-03 LAB — TSH: TSH: 0.17 u[IU]/mL — ABNORMAL LOW (ref 0.35–4.50)

## 2021-02-03 LAB — T4, FREE: Free T4: 1.22 ng/dL (ref 0.60–1.60)

## 2021-02-03 LAB — VITAMIN D 25 HYDROXY (VIT D DEFICIENCY, FRACTURES): VITD: 56.33 ng/mL (ref 30.00–100.00)

## 2021-02-03 MED ORDER — LEVOTHYROXINE SODIUM 88 MCG PO TABS: 88.0000 ug | ORAL_TABLET | Freq: Every day | ORAL | 3 refills | Status: DC

## 2021-02-03 NOTE — Patient Instructions (Signed)
Blood tests are requested for you today.   Let's check the chest x-ray.    We'll let you know about the results.  Please come back for a follow-up appointment in 1 year.

## 2021-02-03 NOTE — Progress Notes (Signed)
Subjective:    Patient ID: Vickie Valdez, female    DOB: 26-Jul-1956, 65 y.o.   MRN: [id number]  HPI Pt returns for f/u of stage-1 PTC-FV  6/09: thyroidectomy for 10 mm papillary ca, follicular variant (margins clear, T1 N0 M0).    8/09: RAI 104 mci 8/09: post-therapy scan pos only at the neck.   1/10:  tg undetectable (ab neg) 1/10: neg thyrogen scan 5/10: tg undetectable (ab neg) 11/10: tg undetectable (ab neg) 5/11:  tg undetectable (ab neg) 5/13: tg undetectable (ab neg) 5/14: tg undetectable (ab neg) 5/15: tg undetectable (ab neg) 5/15: tg=0.1 (ab neg).  5/18: tg=0.2 (ab neg).   12/18: tg=0.2 (ab neg). 9/19: tg=0.2 (ab neg). 9/19: tg=0.2 (ab neg) 6/21: tg=0.2 (ab neg) 3/22: tg=0.2 (ab neg) pt says she does not notice any nodule or swelling at the neck.  postsurgical hypothyroidism:  she takes synthroid as rx'ed.  due to persistent detectable TG, goal TSH is borderline low or slightly suppressed. Main symptom is fatigue.  Vit-D deficiency: she takes 1000 units qd.   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   HYPOTHYROIDISM, POSTSURGICAL 02/20/2008   IBS (irritable bowel syndrome)    Multinodular goiter    Obesity    Other dysphagia 11/09/2007   THYROID CANCER 02/20/2008   stage 1 papillary adenocarcinoma, follicular variant 76/19: thyroidectomy for 22m papillary ca, follicular variant 050/93 104 mci I-131 rx , post-therapy scan pos only at the neck 1/10: tg undectectable (ab neg) neg thyrogen scan 05/10: tg undetectable (ab neg) 11/10: tg undetectable (ab neg) 5/11: tg undectectable (ab neg)    Past Surgical History:  Procedure Laterality Date   BIOPSY THYROID  10/14/2007   Thyroid Ultrasound  08/12/2007   Thyroidecctomy      Social History   Socioeconomic History   Marital status: Married    Spouse name: Not on file   Number of children: Not on file   Years of education: Not on file    Highest education level: Not on file  Occupational History   Occupation: AScientist, physiological    Comment: [employer]  Tobacco Use   Smoking status: Never   Smokeless tobacco: Never  Substance and Sexual Activity   Alcohol use: No   Drug use: No   Sexual activity: Not on file  Other Topics Concern   Not on file  Social History Narrative   Regular exercise-yes   Social Determinants of Health   Financial Resource Strain: Not on file  Food Insecurity: Not on file  Transportation Needs: Not on file  Physical Activity: Not on file  Stress: Not on file  Social Connections: Not on file  Intimate Partner Violence: Not on file    Current Outpatient Medications on File Prior to Visit  Medication Sig Dispense Refill   busPIRone (BUSPAR) 7.5 MG tablet Take 7.5 mg by mouth 2 (two) times daily.      Calcium Carb-Cholecalciferol (CALCIUM 600 + D PO) Take 1 tablet by mouth daily.     clidinium-chlordiazePOXIDE (LIBRAX) 2.5-5 MG per capsule Take 1 capsule by mouth 4 (four) times daily as needed.     diclofenac (VOLTAREN) 75 MG EC tablet Take 1 tablet (75 mg total) by mouth 2 (two) times daily. 60 tablet 1   estradiol (ESTRACE) 0.1 MG/GM vaginal cream Place 1 Applicatorful vaginally 3 (three) times a week.     gabapentin (NEURONTIN) 300 MG capsule  gabapentin 300 mg capsule  TAKE 1 CAPSULE BY MOUTH EVERYDAY AT BEDTIME     hyoscyamine (LEVSIN, ANASPAZ) 0.125 MG tablet      Multiple Vitamin (MULTIVITAMIN) tablet Take 1 tablet by mouth daily.     omeprazole (PRILOSEC) 20 MG capsule Take 20 mg by mouth daily.      No current facility-administered medications on file prior to visit.    Allergies  Allergen Reactions   Aspirin     Upset GI System   Prednisone     Per pt "makes her feel strange"   Penicillins Rash    Family History  Problem Relation Age of Onset   Thyroid disease Sister        Hyperthyroidism    BP 110/70 (BP Location: Right Arm, Patient Position: Sitting, Cuff  Size: Normal)   Pulse 70   Ht '5\' 3"'  (1.6 m)   Wt 163 lb (73.9 kg)   SpO2 98%   BMI 28.87 kg/m    Review of Systems Pt feels left ant chest is more prominent than the right.      Objective:   Physical Exam GENERAL: no distress.   Neck: a healed scar is present.  I do not appreciate a nodule in the thyroid or elsewhere in the neck.     Lab Results  Component Value Date   TSH 0.17 (L) 02/03/2021      Assessment & Plan:  Hypothyroidism: overeplaced.  I have sent a prescription to your pharmacy, to reduce synthroid PTC: recheck labs today Vit-D def: recheck today.

## 2021-02-04 ENCOUNTER — Telehealth: Payer: Self-pay | Admitting: Endocrinology

## 2021-02-04 LAB — THYROGLOBULIN ANTIBODY: Thyroglobulin Ab: <1 IU/mL (ref <=1)

## 2021-02-04 LAB — THYROGLOBULIN LEVEL: Thyroglobulin: 0.2 ng/mL — ABNORMAL LOW

## 2021-02-04 NOTE — Telephone Encounter (Signed)
Patient requests to be called at ph# [phone] to be given thyroid test and chest xray results

## 2021-02-05 NOTE — Telephone Encounter (Signed)
Pt is request her results of Labs and imaging , please advise

## 2021-02-06 NOTE — Telephone Encounter (Signed)
Patient called again to request to be called at ph# [phone] to be given thyroid test and chest xray results

## 2021-02-07 ENCOUNTER — Telehealth: Payer: Self-pay | Admitting: Endocrinology

## 2021-02-07 NOTE — Telephone Encounter (Signed)
Patient came into office since she was never able to get through over the phone, Vickie Valdez was up here and explained Dr Cordelia Pen response and informed patient of her result. Patient has no other questions and she knows her new prescription has been sent to her pharmacy for Levothyroxine. FYI

## 2021-02-12 NOTE — Telephone Encounter (Signed)
error 

## 2021-02-14 NOTE — Telephone Encounter (Signed)
Patient called again she is requesting the following:  #1 - please print out and mail to her the results from her most recent lab draw.  #2 - patient requesting a call back to get a more robust explanation of her lab results.  States that having to come into the office and be told her results were normal is not enough of an explanation.  She wants a more robust and detailed explanation of her lab results, she wants to know why her medication dosage was changed and how this relates to her lab results & her current health status.  She also wants to know what she needs to do for her health going forward.  Please be advised that I did offer to make her an appointment with her provider in order to address these issues and she adamantly declined this option.  She advises that getting a call back should not be so difficult.  Call back number is [phone]

## 2021-02-17 ENCOUNTER — Encounter: Payer: Self-pay | Admitting: Endocrinology

## 2021-02-17 NOTE — Telephone Encounter (Signed)
Sent off results to address on file 02/17/21

## 2021-02-17 NOTE — Telephone Encounter (Signed)
Lab results sent to address on file

## 2021-02-19 NOTE — Telephone Encounter (Signed)
Pt called the office to report her BP is lower than usual and would like to see if she could talk to the nurse. She states she would also like a call to go over her labs and medication dosage as well. I informed the pt we did send off her lab results in the mail on 7/11 but patient was addiment she would like a call from the office.  Ph# [phone]

## 2021-02-19 NOTE — Telephone Encounter (Signed)
Made appt for pt so she can come into the office and speak to the Dr regarding all this Friday (7/15)

## 2021-02-19 NOTE — Telephone Encounter (Signed)
Thanks

## 2021-02-20 DIAGNOSIS — Z20822 Contact with and (suspected) exposure to covid-19: Secondary | ICD-10-CM | POA: Diagnosis not present

## 2021-02-20 DIAGNOSIS — U071 COVID-19: Secondary | ICD-10-CM | POA: Diagnosis not present

## 2021-02-20 DIAGNOSIS — R5383 Other fatigue: Secondary | ICD-10-CM | POA: Diagnosis not present

## 2021-02-21 ENCOUNTER — Ambulatory Visit: Payer: BC Managed Care – PPO | Admitting: Endocrinology

## 2021-02-24 ENCOUNTER — Other Ambulatory Visit: Payer: Self-pay

## 2021-02-24 ENCOUNTER — Ambulatory Visit: Payer: BC Managed Care – PPO | Admitting: Podiatry

## 2021-02-24 ENCOUNTER — Ambulatory Visit (INDEPENDENT_AMBULATORY_CARE_PROVIDER_SITE_OTHER): Payer: BC Managed Care – PPO

## 2021-02-24 ENCOUNTER — Encounter: Payer: Self-pay | Admitting: Podiatry

## 2021-02-24 DIAGNOSIS — Q828 Other specified congenital malformations of skin: Secondary | ICD-10-CM | POA: Diagnosis not present

## 2021-02-24 DIAGNOSIS — D169 Benign neoplasm of bone and articular cartilage, unspecified: Secondary | ICD-10-CM

## 2021-02-24 DIAGNOSIS — M79672 Pain in left foot: Secondary | ICD-10-CM

## 2021-02-25 ENCOUNTER — Ambulatory Visit: Payer: BC Managed Care – PPO | Admitting: Endocrinology

## 2021-02-25 ENCOUNTER — Other Ambulatory Visit (INDEPENDENT_AMBULATORY_CARE_PROVIDER_SITE_OTHER): Payer: BC Managed Care – PPO

## 2021-02-25 VITALS — BP 110/60 | HR 59 | Ht 63.0 in | Wt 164.6 lb

## 2021-02-25 DIAGNOSIS — E890 Postprocedural hypothyroidism: Secondary | ICD-10-CM

## 2021-02-25 DIAGNOSIS — C73 Malignant neoplasm of thyroid gland: Secondary | ICD-10-CM

## 2021-02-25 LAB — T4, FREE: Free T4: 1.13 ng/dL (ref 0.60–1.60)

## 2021-02-25 LAB — TSH: TSH: 1.15 u[IU]/mL (ref 0.35–5.50)

## 2021-02-25 NOTE — Progress Notes (Signed)
Subjective:    Patient ID: Vickie Valdez, female    DOB: [birth year], 65 y.o.   MRN: [id number]  HPI Pt returns for f/u of stage-1 PTC-FV  64/09: thyroidectomy for 10 mm papillary ca, follicular variant (margins clear, T1 N0 M0).    8/09: RAI 104 mci 8/09: post-therapy scan pos only at the neck.   1/10:  tg undetectable (ab neg) 1/10: neg thyrogen scan 5/10: tg undetectable (ab neg) 11/10: tg undetectable (ab neg) 5/11:  tg undetectable (ab neg) 5/13: tg undetectable (ab neg) 5/14: tg undetectable (ab neg) 5/15: tg undetectable (ab neg) 5/15: tg=0.1 (ab neg).  5/18: tg=0.2 (ab neg).   12/18: tg=0.2 (ab neg). 9/19: tg=0.2 (ab neg). 9/19: tg=0.2 (ab neg) 6/21: tg=0.2 (ab neg) 3/22: tg=0.2 (ab neg) pt says she does not notice any nodule or swelling at the neck.  postsurgical hypothyroidism:  she takes synthroid as rx'ed.  due to persistent detectable TG, goal TSH is borderline low or slightly suppressed.  Vit-D deficiency: she takes 1000 units qd.   pt states she feels well in general. 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   HYPOTHYROIDISM, POSTSURGICAL 02/20/2008   IBS (irritable bowel syndrome)    Multinodular goiter    Obesity    Other dysphagia 11/09/2007   THYROID CANCER 02/20/2008   stage 1 papillary adenocarcinoma, follicular variant 65/99: thyroidectomy for 64m papillary ca, follicular variant 035/64 104 mci I-131 rx , post-therapy scan pos only at the neck 1/10: tg undectectable (ab neg) neg thyrogen scan 05/10: tg undetectable (ab neg) 11/10: tg undetectable (ab neg) 5/11: tg undectectable (ab neg)    Past Surgical History:  Procedure Laterality Date   BIOPSY THYROID  10/14/2007   Thyroid Ultrasound  08/12/2007   Thyroidecctomy      Social History   Socioeconomic History   Marital status: Married    Spouse name: Not on file   Number of children: Not on file   Years of education:  Not on file   Highest education level: Not on file  Occupational History   Occupation: AScientist, physiological    Comment: [employer]  Tobacco Use   Smoking status: Never   Smokeless tobacco: Never  Substance and Sexual Activity   Alcohol use: No   Drug use: No   Sexual activity: Not on file  Other Topics Concern   Not on file  Social History Narrative   Regular exercise-yes   Social Determinants of Health   Financial Resource Strain: Not on file  Food Insecurity: Not on file  Transportation Needs: Not on file  Physical Activity: Not on file  Stress: Not on file  Social Connections: Not on file  Intimate Partner Violence: Not on file    Current Outpatient Medications on File Prior to Visit  Medication Sig Dispense Refill   busPIRone (BUSPAR) 7.5 MG tablet Take 7.5 mg by mouth 2 (two) times daily.      Calcium Carb-Cholecalciferol (CALCIUM 600 + D PO) Take 1 tablet by mouth daily.     clidinium-chlordiazePOXIDE (LIBRAX) 2.5-5 MG per capsule Take 1 capsule by mouth 4 (four) times daily as needed.     diclofenac (VOLTAREN) 75 MG EC tablet Take 1 tablet (75 mg total) by mouth 2 (two) times daily. 60 tablet 1   estradiol (ESTRACE) 0.1 MG/GM vaginal cream Place 1 Applicatorful vaginally 3 (three) times a week.     gabapentin (NEURONTIN)  300 MG capsule gabapentin 300 mg capsule  TAKE 1 CAPSULE BY MOUTH EVERYDAY AT BEDTIME     hyoscyamine (LEVSIN, ANASPAZ) 0.125 MG tablet      levothyroxine (SYNTHROID) 88 MCG tablet Take 1 tablet (88 mcg total) by mouth daily. 90 tablet 3   Multiple Vitamin (MULTIVITAMIN) tablet Take 1 tablet by mouth daily.     omeprazole (PRILOSEC) 20 MG capsule Take 20 mg by mouth daily.      No current facility-administered medications on file prior to visit.    Allergies  Allergen Reactions   Aspirin     Upset GI System   Prednisone     Per pt "makes her feel strange"   Penicillins Rash    Family History  Problem Relation Age of Onset   Thyroid  disease Sister        Hyperthyroidism    BP 110/60 (BP Location: Right Arm, Patient Position: Sitting, Cuff Size: Normal)   Pulse (!) 59   Ht 5' 3" (1.6 m)   Wt 164 lb 9.6 oz (74.7 kg)   SpO2 98%   BMI 29.16 kg/m   Review of Systems     Objective:   Physical Exam Neck: a healed scar is present.  I do not appreciate a nodule in the thyroid or elsewhere in the neck   Lab Results  Component Value Date   TSH 1.15 02/25/2021      Assessment & Plan:  Hypothyroidism: as TSH has fluctuated, we'll continue the same synthroid for now. PTC: recheck today

## 2021-02-25 NOTE — Progress Notes (Signed)
Subjective:   Patient ID: Vickie Valdez, female   DOB: 65 y.o.   MRN: [id number]   HPI Patient presents stating she is getting painful calluses on the fifth metatarsals of both feet and on her left foot she does have some prominence of the bone structure and had bunion surgery approximately 20 years ago   ROS      Objective:  Physical Exam  Neurovascular status intact with 2 separate problems with 1 being lesions on the fifth metatarsal bilateral and the other being prominence around the first metatarsal head left that is irritated when pressed      Assessment:  Probability that a bone spur has formed around the first metatarsal left over the years with mild hallux limitus condition along with keratotic lesions on the fifth metatarsal     Plan:  8 H&P x-rays reviewed left educated her on both conditions and debrided lesions advising on cushioned shoes and offloading.  For the first metatarsal I did discuss possible bone spur resection in future but organ to hold off on this and consider this if symptoms were to get worse  X-rays indicate there is some spurring around the first metatarsal head left localized

## 2021-02-25 NOTE — Patient Instructions (Addendum)
Blood tests are requested for you today.  We'll let you know about the results.  Please come back for a follow-up appointment in 1 year.    

## 2021-02-26 LAB — THYROGLOBULIN LEVEL: Thyroglobulin: 0.2 ng/mL — ABNORMAL LOW

## 2021-02-26 LAB — THYROGLOBULIN ANTIBODY: Thyroglobulin Ab: <1 IU/mL (ref <=1)

## 2021-02-28 ENCOUNTER — Institutional Professional Consult (permissible substitution): Payer: BC Managed Care – PPO | Admitting: Plastic Surgery

## 2021-03-04 ENCOUNTER — Institutional Professional Consult (permissible substitution): Payer: BC Managed Care – PPO | Admitting: Plastic Surgery

## 2021-03-10 ENCOUNTER — Ambulatory Visit (INDEPENDENT_AMBULATORY_CARE_PROVIDER_SITE_OTHER): Payer: BC Managed Care – PPO | Admitting: *Deleted

## 2021-03-10 ENCOUNTER — Other Ambulatory Visit: Payer: Self-pay

## 2021-03-10 DIAGNOSIS — M205X9 Other deformities of toe(s) (acquired), unspecified foot: Secondary | ICD-10-CM

## 2021-03-10 DIAGNOSIS — M205X2 Other deformities of toe(s) (acquired), left foot: Secondary | ICD-10-CM | POA: Diagnosis not present

## 2021-03-10 DIAGNOSIS — M205X1 Other deformities of toe(s) (acquired), right foot: Secondary | ICD-10-CM | POA: Diagnosis not present

## 2021-03-10 NOTE — Progress Notes (Signed)
Patient presents today to be casted for custom molded orthotics. Dr. Paulla Dolly has been treating patient for hallux limitus.   Impression foam cast was taken. ABN signed.  Patient info-  Shoe size: 9  Shoe style: Sneakers  Height: 5'3  Weight: 161   Patient will be notified once orthotics arrive in office and reappoint for fitting at that time.

## 2021-03-28 DIAGNOSIS — E559 Vitamin D deficiency, unspecified: Secondary | ICD-10-CM | POA: Diagnosis not present

## 2021-03-28 DIAGNOSIS — R5382 Chronic fatigue, unspecified: Secondary | ICD-10-CM | POA: Diagnosis not present

## 2021-04-24 DIAGNOSIS — Z23 Encounter for immunization: Secondary | ICD-10-CM | POA: Diagnosis not present

## 2021-04-24 DIAGNOSIS — E7800 Pure hypercholesterolemia, unspecified: Secondary | ICD-10-CM | POA: Diagnosis not present

## 2021-04-24 DIAGNOSIS — Z0000 Encounter for general adult medical examination without abnormal findings: Secondary | ICD-10-CM | POA: Diagnosis not present

## 2021-04-24 DIAGNOSIS — E559 Vitamin D deficiency, unspecified: Secondary | ICD-10-CM | POA: Diagnosis not present

## 2021-05-02 ENCOUNTER — Other Ambulatory Visit: Payer: BC Managed Care – PPO

## 2021-05-06 DIAGNOSIS — M25562 Pain in left knee: Secondary | ICD-10-CM | POA: Diagnosis not present

## 2021-05-13 ENCOUNTER — Other Ambulatory Visit: Payer: Self-pay | Admitting: Endocrinology

## 2021-07-06 DIAGNOSIS — R059 Cough, unspecified: Secondary | ICD-10-CM | POA: Diagnosis not present

## 2021-07-06 DIAGNOSIS — Z03818 Encounter for observation for suspected exposure to other biological agents ruled out: Secondary | ICD-10-CM | POA: Diagnosis not present

## 2021-07-06 DIAGNOSIS — J069 Acute upper respiratory infection, unspecified: Secondary | ICD-10-CM | POA: Diagnosis not present

## 2021-07-06 DIAGNOSIS — R6883 Chills (without fever): Secondary | ICD-10-CM | POA: Diagnosis not present

## 2021-07-08 DIAGNOSIS — J069 Acute upper respiratory infection, unspecified: Secondary | ICD-10-CM | POA: Diagnosis not present

## 2021-08-08 ENCOUNTER — Ambulatory Visit: Payer: BC Managed Care – PPO | Admitting: Podiatry

## 2021-09-09 DIAGNOSIS — M25541 Pain in joints of right hand: Secondary | ICD-10-CM | POA: Diagnosis not present

## 2021-09-15 DIAGNOSIS — H52203 Unspecified astigmatism, bilateral: Secondary | ICD-10-CM | POA: Diagnosis not present

## 2021-09-15 DIAGNOSIS — H5213 Myopia, bilateral: Secondary | ICD-10-CM | POA: Diagnosis not present

## 2021-09-15 DIAGNOSIS — H524 Presbyopia: Secondary | ICD-10-CM | POA: Diagnosis not present

## 2021-09-15 DIAGNOSIS — H25813 Combined forms of age-related cataract, bilateral: Secondary | ICD-10-CM | POA: Diagnosis not present

## 2021-09-19 DIAGNOSIS — Z1231 Encounter for screening mammogram for malignant neoplasm of breast: Secondary | ICD-10-CM | POA: Diagnosis not present

## 2021-09-29 DIAGNOSIS — R350 Frequency of micturition: Secondary | ICD-10-CM | POA: Diagnosis not present

## 2021-10-06 DIAGNOSIS — Z6829 Body mass index (BMI) 29.0-29.9, adult: Secondary | ICD-10-CM | POA: Diagnosis not present

## 2021-10-06 DIAGNOSIS — E611 Iron deficiency: Secondary | ICD-10-CM | POA: Diagnosis not present

## 2021-10-06 DIAGNOSIS — N951 Menopausal and female climacteric states: Secondary | ICD-10-CM | POA: Diagnosis not present

## 2021-10-06 DIAGNOSIS — E039 Hypothyroidism, unspecified: Secondary | ICD-10-CM | POA: Diagnosis not present

## 2021-10-06 DIAGNOSIS — E7800 Pure hypercholesterolemia, unspecified: Secondary | ICD-10-CM | POA: Diagnosis not present

## 2021-10-06 DIAGNOSIS — E559 Vitamin D deficiency, unspecified: Secondary | ICD-10-CM | POA: Diagnosis not present

## 2021-10-06 DIAGNOSIS — R635 Abnormal weight gain: Secondary | ICD-10-CM | POA: Diagnosis not present

## 2021-10-31 DIAGNOSIS — E039 Hypothyroidism, unspecified: Secondary | ICD-10-CM | POA: Diagnosis not present

## 2021-10-31 DIAGNOSIS — Z6829 Body mass index (BMI) 29.0-29.9, adult: Secondary | ICD-10-CM | POA: Diagnosis not present

## 2021-11-07 ENCOUNTER — Encounter: Payer: Self-pay | Admitting: Endocrinology

## 2021-11-07 ENCOUNTER — Ambulatory Visit: Payer: BC Managed Care – PPO | Admitting: Endocrinology

## 2021-11-07 VITALS — BP 136/88 | HR 70 | Ht 63.0 in | Wt 168.8 lb

## 2021-11-07 DIAGNOSIS — E890 Postprocedural hypothyroidism: Secondary | ICD-10-CM

## 2021-11-07 DIAGNOSIS — C73 Malignant neoplasm of thyroid gland: Secondary | ICD-10-CM | POA: Diagnosis not present

## 2021-11-07 DIAGNOSIS — E209 Hypoparathyroidism, unspecified: Secondary | ICD-10-CM | POA: Diagnosis not present

## 2021-11-07 LAB — TSH: TSH: 2.63 u[IU]/mL (ref 0.35–5.50)

## 2021-11-07 LAB — VITAMIN D 25 HYDROXY (VIT D DEFICIENCY, FRACTURES): VITD: 37.97 ng/mL (ref 30.00–100.00)

## 2021-11-07 LAB — T4, FREE: Free T4: 1.05 ng/dL (ref 0.60–1.60)

## 2021-11-07 NOTE — Patient Instructions (Addendum)
Blood tests are requested for you today.  We'll let you know about the results.  Please come back for a follow-up appointment in 1 year.    

## 2021-11-07 NOTE — Progress Notes (Signed)
? ?Subjective:  ? ? Patient ID: Vickie Valdez, female    DOB: 21-Jul-1956, 66 y.o.   MRN: [id number] ? ?HPI ?Pt returns for f/u of stage-1 PTC-FV  ?6/09: thyroidectomy for 10 mm papillary ca, follicular variant (margins clear, T1 N0 M0).    ?8/09: RAI 104 mci ?8/09: post-therapy scan pos only at the neck.   ?1/10:  tg undetectable (ab neg) ?1/10: neg thyrogen scan ?5/10: tg undetectable (ab neg) ?11/10: tg undetectable (ab neg) ?5/11:  tg undetectable (ab neg) ?5/13: tg undetectable (ab neg) ?5/14: tg undetectable (ab neg) ?5/15: tg undetectable (ab neg) ?5/15: tg=0.1 (ab neg).  ?5/18: tg=0.2 (ab neg).   ?12/18: tg=0.2 (ab neg). ?9/19: tg=0.2 (ab neg). ?9/19: tg=0.2 (ab neg) ?6/21: tg=0.2 (ab neg) ?3/22: tg=0.2 (ab neg) ?pt says she does not notice any nodule or swelling at the neck.  ?postsurgical hypothyroidism:  she takes synthroid as rx'ed.  due to persistent detectable TG, goal TSH is borderline low or slightly suppressed.   ?Vit-D deficiency: she takes 1000 units qd.   ?pt states she feels well in general, except for fatigue.  Denies neck swelling and pain.   ?Past Medical History:  ?Diagnosis Date  ? Allergic rhinitis   ? GERD 11/08/2007  ? 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 IBS (irritable bowel syndrome)   ? Multinodular goiter   ? Obesity   ? Other dysphagia 11/09/2007  ? THYROID CANCER 02/20/2008  ? stage 1 papillary adenocarcinoma, follicular variant 63/84: thyroidectomy for 40m papillary ca, follicular variant 066/59 104 mci I-131 rx , post-therapy scan pos only at the neck 1/10: tg undectectable (ab neg) neg thyrogen scan 05/10: tg undetectable (ab neg) 11/10: tg undetectable (ab neg) 5/11: tg undectectable (ab neg)  ? ? ?Past Surgical History:  ?Procedure Laterality Date  ? BIOPSY THYROID  10/14/2007  ? Thyroid Ultrasound  08/12/2007  ? Thyroidecctomy    ? ? ?Social History  ? ?Socioeconomic History  ? Marital status: Married  ?  Spouse name: Not on file  ?  Number of children: Not on file  ? Years of education: Not on file  ? Highest education level: Not on file  ?Occupational History  ? Occupation: AScientist, physiological  ?  Comment: [employer]  ?Tobacco Use  ? Smoking status: Never  ? Smokeless tobacco: Never  ?Substance and Sexual Activity  ? Alcohol use: No  ? Drug use: No  ? Sexual activity: Not on file  ?Other Topics Concern  ? Not on file  ?Social History Narrative  ? Regular exercise-yes  ? ?Social Determinants of Health  ? ?Financial Resource Strain: Not on file  ?Food Insecurity: Not on file  ?Transportation Needs: Not on file  ?Physical Activity: Not on file  ?Stress: Not on file  ?Social Connections: Not on file  ?Intimate Partner Violence: Not on file  ? ? ?Current Outpatient Medications on File Prior to Visit  ?Medication Sig Dispense Refill  ? busPIRone (BUSPAR) 7.5 MG tablet Take 7.5 mg by mouth 2 (two) times daily.     ? Calcium Carb-Cholecalciferol (CALCIUM 600 + D PO) Take 1 tablet by mouth daily.    ? clidinium-chlordiazePOXIDE (LIBRAX) 2.5-5 MG per capsule Take 1 capsule by mouth 4 (four) times daily as needed.    ? diclofenac (VOLTAREN) 75 MG EC tablet Take 1 tablet (75 mg total) by mouth 2 (two) times daily. 60 tablet 1  ? estradiol (ESTRACE) 0.1 MG/GM vaginal cream Place 1 Applicatorful  vaginally 3 (three) times a week.    ? gabapentin (NEURONTIN) 300 MG capsule gabapentin 300 mg capsule ? TAKE 1 CAPSULE BY MOUTH EVERYDAY AT BEDTIME    ? hyoscyamine (LEVSIN, ANASPAZ) 0.125 MG tablet     ? levothyroxine (SYNTHROID) 88 MCG tablet Take 1 tablet (88 mcg total) by mouth daily. 90 tablet 3  ? Multiple Vitamin (MULTIVITAMIN) tablet Take 1 tablet by mouth daily.    ? omeprazole (PRILOSEC) 20 MG capsule Take 20 mg by mouth daily.     ? ?No current facility-administered medications on file prior to visit.  ? ? ? ? ?Family History  ?Problem Relation Age of Onset  ? Thyroid disease Sister   ?     Hyperthyroidism  ? ? ?BP 136/88 (BP Location: Left Arm,  Patient Position: Sitting, Cuff Size: Normal)   Pulse 70   Ht '5\' 3"'  (1.6 m)   Wt 168 lb 12.8 oz (76.6 kg)   SpO2 98%   BMI 29.90 kg/m?  ? ? ?Review of Systems ? ?   ?Objective:  ? Physical Exam ?VITAL SIGNS:  See vs page ?GENERAL: no distress ?Neck: a healed scar is present.  I do not appreciate a nodule in the thyroid or elsewhere in the neck.   ? ? ?Lab Results  ?Component Value Date  ? TSH 2.63 11/07/2021  ? ?   ?Assessment & Plan:  ?Hypothyroidism: well-controlled.  Please continue the same synthroid ?PTC: recheck today ? ?

## 2021-11-10 ENCOUNTER — Ambulatory Visit: Payer: BC Managed Care – PPO | Admitting: Endocrinology

## 2021-11-10 LAB — THYROGLOBULIN LEVEL: Thyroglobulin: 0.4 ng/mL — ABNORMAL LOW

## 2021-11-10 LAB — PTH, INTACT AND CALCIUM
Calcium: 8.9 mg/dL (ref 8.6–10.4)
PTH: 25 pg/mL (ref 16–77)

## 2021-11-10 LAB — THYROGLOBULIN ANTIBODY: Thyroglobulin Ab: <1 IU/mL (ref <=1)

## 2021-11-11 DIAGNOSIS — R5382 Chronic fatigue, unspecified: Secondary | ICD-10-CM | POA: Diagnosis not present

## 2021-12-02 DIAGNOSIS — W57XXXA Bitten or stung by nonvenomous insect and other nonvenomous arthropods, initial encounter: Secondary | ICD-10-CM | POA: Diagnosis not present

## 2021-12-02 DIAGNOSIS — S20161A Insect bite (nonvenomous) of breast, right breast, initial encounter: Secondary | ICD-10-CM | POA: Diagnosis not present

## 2021-12-10 IMAGING — CR DG CHEST 2V
2 series · 2 of 2 positions shown · non-contrast
Comparison: 02/06/2008

CLINICAL DATA: Chest wall palpable abnormality

EXAM:
CHEST - 2 VIEW

[w chest pa]
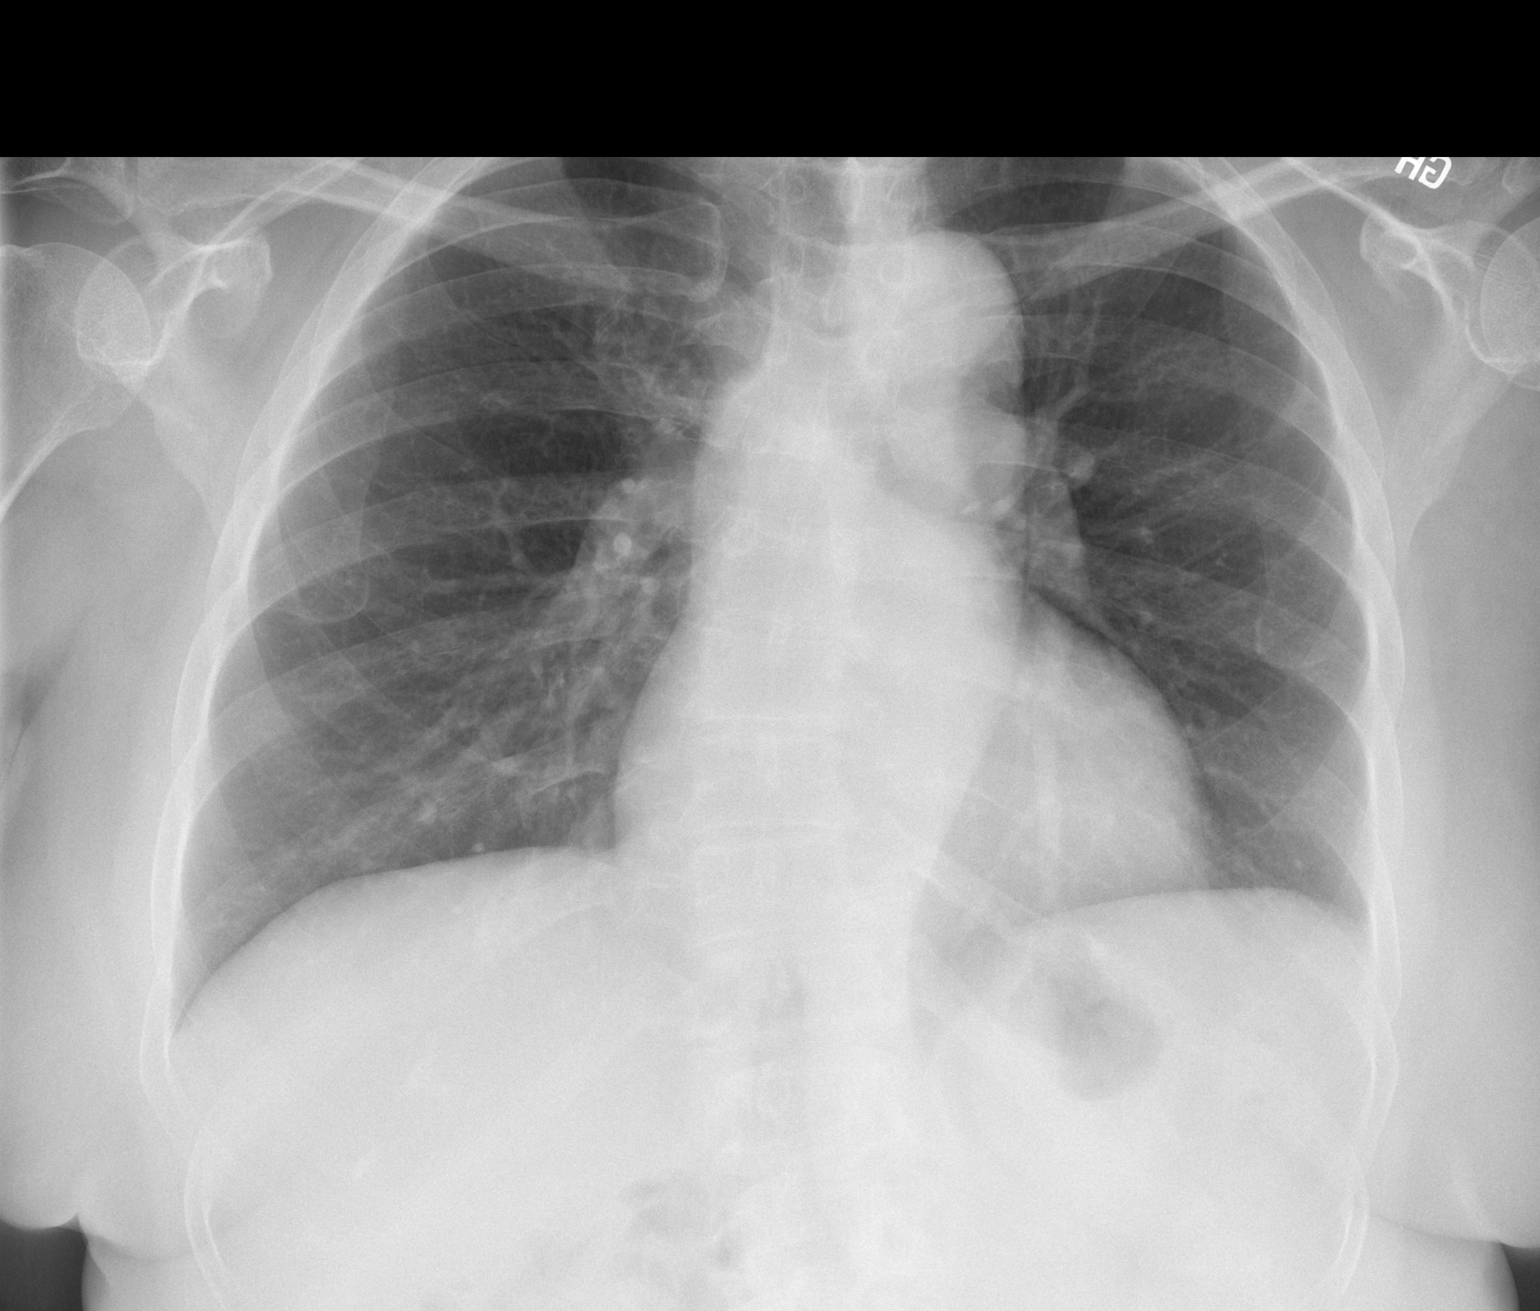

[w chest lat]
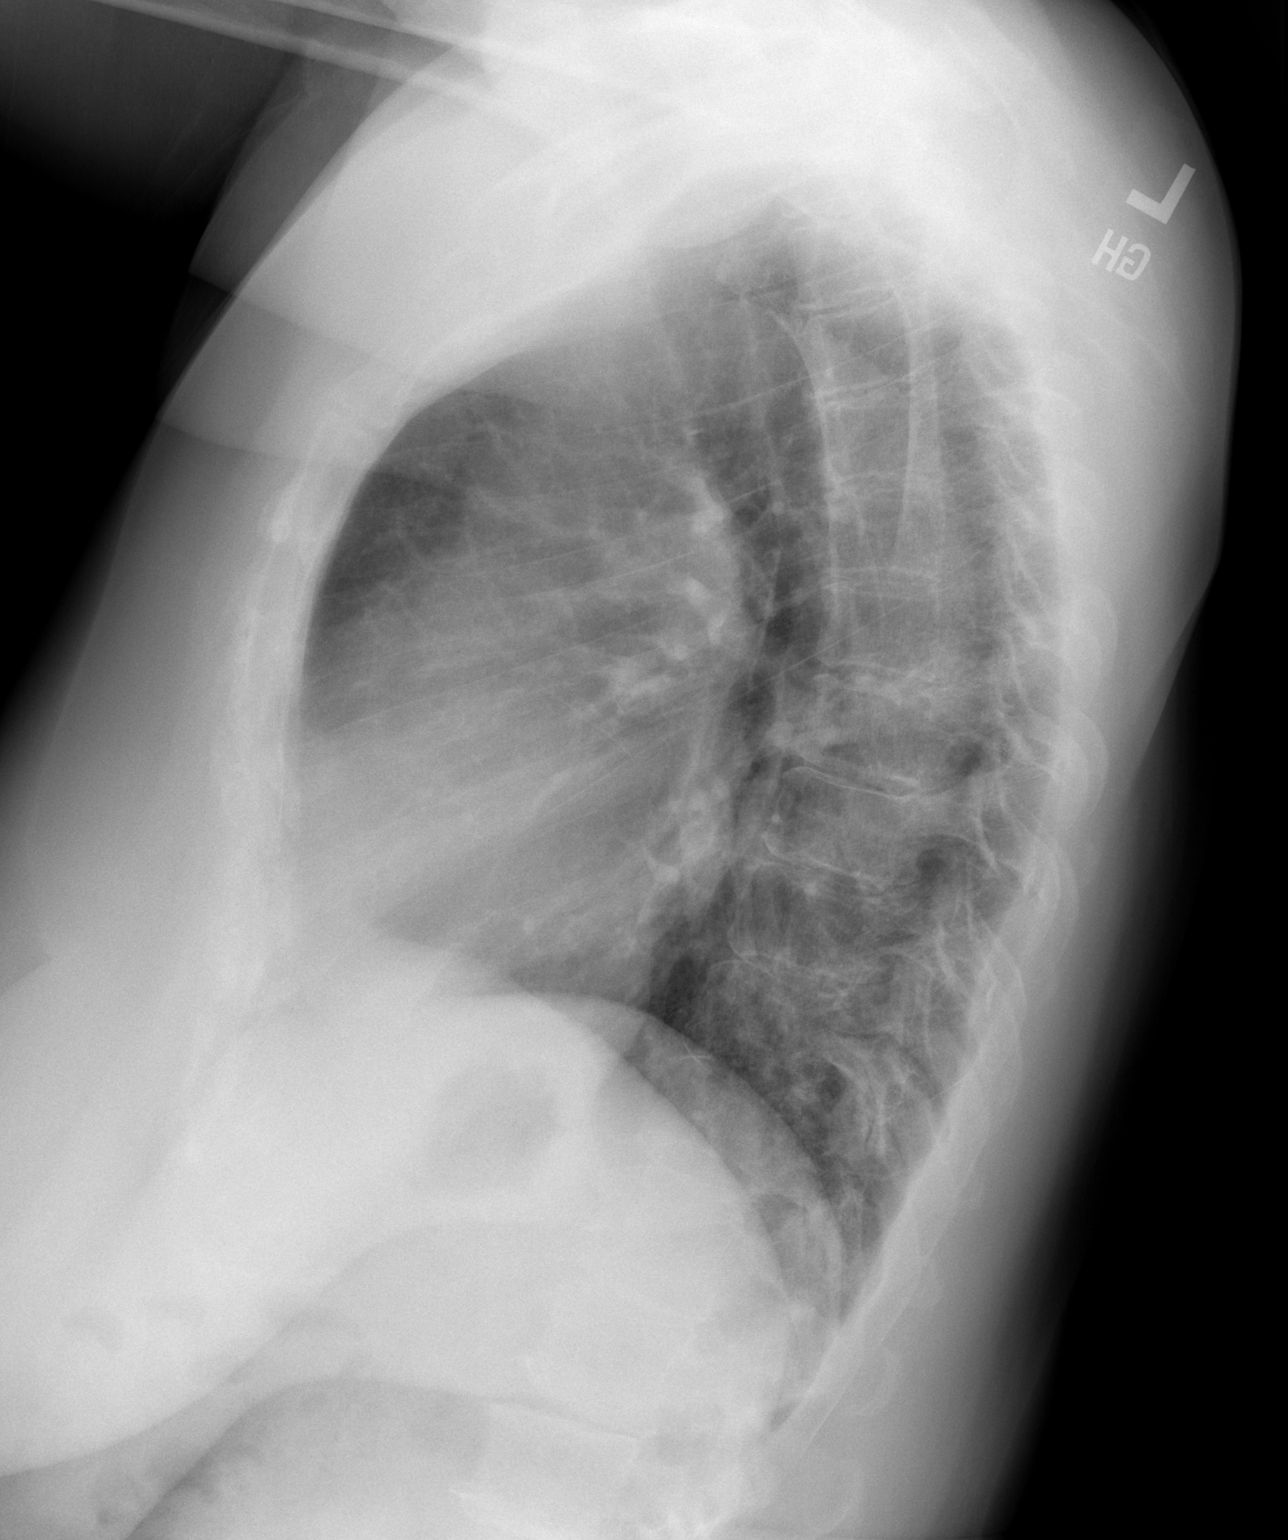

[2 of 2 positions shown; findings below may reference images not displayed]

FINDINGS: Lungs are well expanded, symmetric, and clear. No pneumothorax or
pleural effusion. Cardiac size within normal limits. Pulmonary
vascularity is normal. Osseous structures are age-appropriate. Mild
thoracic sigmoid curvature, unchanged. No acute bone abnormality.
IMPRESSION: No active cardiopulmonary disease.

## 2021-12-25 ENCOUNTER — Other Ambulatory Visit: Payer: BC Managed Care – PPO

## 2021-12-26 ENCOUNTER — Ambulatory Visit: Payer: BC Managed Care – PPO | Admitting: Podiatry

## 2021-12-26 DIAGNOSIS — Q828 Other specified congenital malformations of skin: Secondary | ICD-10-CM | POA: Diagnosis not present

## 2021-12-26 DIAGNOSIS — M7752 Other enthesopathy of left foot: Secondary | ICD-10-CM

## 2021-12-26 NOTE — Progress Notes (Signed)
Subjective:   Patient ID: Vickie Valdez, female   DOB: 66 y.o.   MRN: [id number]   HPI Patient presents with inflammation around the fifth metatarsal head left and also has multiple lesions on both feet with pain that she cannot take care of   ROS      Objective:  Physical Exam  Fluid buildup noted with fluid buildup around the fifth MPJ left and pain and lesion subsecond metatarsal left some fifth metatarsal right subfifth metatarsal left      Assessment:  Inflammatory capsulitis of the fifth MPJ left foot along with numerous porokeratotic lesions     Plan:  H&P reviewed condition debrided lesion sterile sharp instrumentation iatrogenic bleeding and did sterile prep and injected the capsule of the fifth MPJ 3 mg Dexasone Kenalog 5 mg Xylocaine.  Reappoint as symptoms indicate

## 2022-01-02 DIAGNOSIS — M25511 Pain in right shoulder: Secondary | ICD-10-CM | POA: Diagnosis not present

## 2022-01-02 DIAGNOSIS — M47812 Spondylosis without myelopathy or radiculopathy, cervical region: Secondary | ICD-10-CM | POA: Diagnosis not present

## 2022-01-07 ENCOUNTER — Other Ambulatory Visit: Payer: BC Managed Care – PPO

## 2022-01-09 ENCOUNTER — Ambulatory Visit: Payer: BC Managed Care – PPO | Admitting: Endocrinology

## 2022-01-09 ENCOUNTER — Ambulatory Visit: Payer: BC Managed Care – PPO | Admitting: Internal Medicine

## 2022-01-12 ENCOUNTER — Ambulatory Visit: Payer: BC Managed Care – PPO | Admitting: Internal Medicine

## 2022-01-12 ENCOUNTER — Encounter: Payer: Self-pay | Admitting: Internal Medicine

## 2022-01-12 VITALS — BP 130/92 | HR 71 | Ht 63.0 in | Wt 165.4 lb

## 2022-01-12 DIAGNOSIS — E890 Postprocedural hypothyroidism: Secondary | ICD-10-CM

## 2022-01-12 DIAGNOSIS — C73 Malignant neoplasm of thyroid gland: Secondary | ICD-10-CM | POA: Diagnosis not present

## 2022-01-12 DIAGNOSIS — E209 Hypoparathyroidism, unspecified: Secondary | ICD-10-CM | POA: Diagnosis not present

## 2022-01-12 LAB — T4, FREE: Free T4: 1.11 ng/dL (ref 0.60–1.60)

## 2022-01-12 LAB — TSH: TSH: 3.8 u[IU]/mL (ref 0.35–5.50)

## 2022-01-12 MED ORDER — LEVOTHYROXINE SODIUM 100 MCG PO TABS: 100.0000 ug | ORAL_TABLET | Freq: Every day | ORAL | 3 refills | Status: DC

## 2022-01-12 NOTE — Patient Instructions (Signed)
Please continue Levothyroxine 88 mcg daily.  Take the thyroid hormone every day, with water, at least 30 minutes before breakfast, separated by at least 4 hours from: - acid reflux medications - calcium - iron - multivitamins  Move calcium, multivitamins later in the day.  Please stop at the lab.  We will check a neck ultrasound.   Please return in 6 months.

## 2022-01-12 NOTE — Progress Notes (Addendum)
Patient ID: Vickie Valdez, female   DOB: Apr 10, 1956, 66 y.o   MRN: [id number]  HPI  Vickie Valdez is a 66 y.o.-year-old female, returning for follow-up for thyroid cancer, postsurgical hypothyroidism.  She previously saw Dr. Loanne Valdez, last visit with him 1.5 months.  Pt. has been dx with follicular variant of papillary thyroid cancer in 2009.   Reviewed patient's previous history per Epic records and Dr. Cordelia Valdez notes; 6/09: thyroidectomy for MNG >> a 1 cm follicular variant of papillary thyroid cancer (margins clear, T1 N0 M0).    8/09: RAI I131 104 mCi 8/09: post-therapy WBS pos only at the neck.   1/10: thyrogen-stim WBS neg  Review thyroglobulin levels: Lab Results  Component Value Date   THYROGLB 0.4 (L) 11/07/2021   THYROGLB 0.2 (L) 02/25/2021   THYROGLB 0.2 (L) 02/03/2021   THYROGLB 0.2 (L) 10/22/2020   THYROGLB 0.2 (L) 02/02/2020   THYROGLB 0.2 (L) 01/20/2019   THYROGLB 0.2 (L) 04/15/2018   THYROGLB 0.2 (L) 07/23/2017   THYROGLB 0.2 (L) 04/22/2017   THYROGLB 0.2 (L) 01/01/2017   THYROGLB 0.1 (L) 12/26/2014   THYROGLB <0.2 12/15/2013   THYROGLB <0.2 12/16/2012   THYROGLB <0.2 12/11/2011   THYROGLB <0.2 07/27/2011   THYROGLB <0.2 12/11/2010   THGAB <1 11/07/2021   THGAB <1 02/25/2021   THGAB <1 02/03/2021   THGAB <1 10/22/2020   THGAB <1 02/02/2020   THGAB <1 01/20/2019   THGAB <1 04/15/2018   THGAB <1 07/23/2017   THGAB <1 04/22/2017   THGAB <1 01/01/2017   THGAB <1 12/26/2014   THGAB <20.0 12/15/2013   THGAB <20.0 12/16/2012   THGAB <20.0 12/11/2011   THGAB <20.0 07/27/2011   THGAB <20.0 12/11/2010   THGAB <20.0 12/11/2010   THGAB <30.0 U/mL 12/10/2009  5/11:  tg undetectable (ab neg) 11/10: tg undetectable (ab neg) 5/10: tg undetectable (ab neg) 1/10:  tg undetectable (ab neg)  Pt denies: - feeling nodules in neck - hoarseness - dysphagia - choking - SOB with lying down  For her postsurgical hypothyroidism, she is on Levothyroxine 88  mcg, taken: - in am - fasting - at least 30 min from b'fast - + calcium <1h after LT4 - no iron - + multivitamins  <1h after LT4 - + PPIs (Omeprazole) - moved at night ~1.5 mo (prn), prev. in am - not on Biotin  I reviewed pt's thyroid tests: Lab Results  Component Value Date   TSH 2.63 11/07/2021   TSH 1.15 02/25/2021   TSH 0.17 (L) 02/03/2021   TSH 0.70 10/22/2020   TSH 0.43 02/02/2020   TSH 0.11 (L) 01/20/2019   TSH 1.24 04/15/2018   TSH 2.00 07/23/2017   TSH 0.98 04/22/2017   TSH 1.59 01/01/2017   FREET4 1.05 11/07/2021   FREET4 1.13 02/25/2021   FREET4 1.22 02/03/2021   FREET4 1.17 10/22/2020   FREET4 1.5 02/02/2020   FREET4 1.4 01/20/2019   She mentions hot flushes, but no other sxs.  She has a history of postsurgical hypocalcemia: - resolved  Reviewed PTH and calcium levels: Lab Results  Component Value Date   PTH 25 11/07/2021   PTH 25 10/22/2020   PTH 20 02/02/2020   PTH 21 01/20/2019   PTH 22 04/15/2018   PTH 29 04/28/2017   PTH CANCELED 04/22/2017   PTH 35.3 12/16/2012   PTH 34.9 07/27/2011   PTH 30.9 12/11/2010   PTH 32.9 12/10/2009   PTH 18.3 06/20/2009   PTH 34.5 08/16/2008   PTH  26.1 05/23/2008   CALCIUM 8.9 11/07/2021   CALCIUM 8.8 10/22/2020   CALCIUM 8.9 10/22/2020   CALCIUM 9.1 02/02/2020   CALCIUM 8.9 01/20/2019   CALCIUM 9.2 04/15/2018   CALCIUM 9.0 04/28/2017   CALCIUM 9.0 04/22/2017   CALCIUM 9.1 12/16/2012   CALCIUM 8.8 07/27/2011   CALCIUM 8.9 12/11/2010   CALCIUM 8.2 (L) 12/10/2009   CALCIUM 8.7 06/20/2009   CALCIUM 8.7 08/16/2008   CALCIUM 9.1 05/23/2008   CALCIUM 7.0 (L) 02/11/2008   CALCIUM 7.3 (L) 02/10/2008   CALCIUM 7.9 (L) 02/09/2008   CALCIUM 9.4 02/06/2008   Reviewed vitamin D levels: Lab Results  Component Value Date   VD25OH 37.97 11/07/2021   VD25OH 56.33 02/03/2021   VD25OH 51.75 10/22/2020   VD25OH 23 (L) 02/02/2020   VD25OH 47 01/20/2019   VD25OH 38.79 04/15/2018   VD25OH 50.68 07/23/2017    VD25OH 25.81 (L) 04/22/2017   VD25OH 33 12/15/2013  She takes 1000 units vitamin D daily.  She has + FH of thyroid disorders in: sister - HTyr. No FH of thyroid cancer.  No h/o radiation tx to head or neck.  She also has a history of IBS, GERD, obesity.  ROS: + see HPI  Past Medical History:  Diagnosis Date   Allergic rhinitis    GERD 11/08/2007   Hypoparathyroidism ([place]) 02/20/2008   HYPOTHYROIDISM, POSTSURGICAL 02/20/2008   IBS (irritable bowel syndrome)    Multinodular goiter    Obesity    Other dysphagia 11/09/2007   THYROID CANCER 02/20/2008   stage 1 papillary adenocarcinoma, follicular variant 11/94: thyroidectomy for 63m papillary ca, follicular variant 017/40 104 mci I-131 rx , post-therapy scan pos only at the neck 1/10: tg undectectable (ab neg) neg thyrogen scan 05/10: tg undetectable (ab neg) 11/10: tg undetectable (ab neg) 5/11: tg undectectable (ab neg)   Past Surgical History:  Procedure Laterality Date   BIOPSY THYROID  10/14/2007   Thyroid Ultrasound  08/12/2007   Thyroidecctomy     Social History   Socioeconomic History   Marital status: Married    Spouse name: Not on file   Number of children: Not on file   Years of education: Not on file   Highest education level: Not on file  Occupational History   Occupation: AScientist, physiological    Comment: [employer]  Tobacco Use   Smoking status: Never   Smokeless tobacco: Never  Substance and Sexual Activity   Alcohol use: No   Drug use: No   Sexual activity: Not on file  Other Topics Concern   Not on file  Social History Narrative   Regular exercise-yes   Social Determinants of Health   Financial Resource Strain: Not on file  Food Insecurity: Not on file  Transportation Needs: Not on file  Physical Activity: Not on file  Stress: Not on file  Social Connections: Not on file  Intimate Partner Violence: Not on file   Current Outpatient Medications on File Prior to Visit  Medication Sig Dispense  Refill   busPIRone (BUSPAR) 7.5 MG tablet Take 7.5 mg by mouth 2 (two) times daily.      Calcium Carb-Cholecalciferol (CALCIUM 600 + D PO) Take 1 tablet by mouth daily.     clidinium-chlordiazePOXIDE (LIBRAX) 2.5-5 MG per capsule Take 1 capsule by mouth 4 (four) times daily as needed.     diclofenac (VOLTAREN) 75 MG EC tablet Take 1 tablet (75 mg total) by mouth 2 (two) times daily. 60 tablet 1  estradiol (ESTRACE) 0.1 MG/GM vaginal cream Place 1 Applicatorful vaginally 3 (three) times a week.     gabapentin (NEURONTIN) 300 MG capsule gabapentin 300 mg capsule  TAKE 1 CAPSULE BY MOUTH EVERYDAY AT BEDTIME     hyoscyamine (LEVSIN, ANASPAZ) 0.125 MG tablet      levothyroxine (SYNTHROID) 88 MCG tablet Take 1 tablet (88 mcg total) by mouth daily. 90 tablet 3   Multiple Vitamin (MULTIVITAMIN) tablet Take 1 tablet by mouth daily.     omeprazole (PRILOSEC) 20 MG capsule Take 20 mg by mouth daily.      No current facility-administered medications on file prior to visit.   Allergies  Allergen Reactions   Aspirin     Upset GI System   Prednisone     Per pt "makes her feel strange"   Penicillins Rash   Family History  Problem Relation Age of Onset   Thyroid disease Sister        Hyperthyroidism    PE: BP (!) 130/92 (BP Location: Left Arm, Patient Position: Sitting, Cuff Size: Normal)   Pulse 71   Ht '5\' 3"'$  (1.6 m)   Wt 165 lb 6.4 oz (75 kg)   SpO2 99%   BMI 29.30 kg/m  Wt Readings from Last 3 Encounters:  01/12/22 165 lb 6.4 oz (75 kg)  11/07/21 168 lb 12.8 oz (76.6 kg)  02/25/21 164 lb 9.6 oz (74.7 kg)   Constitutional: overweight, in NAD Eyes: EOMI, no exophthalmos ENT: moist mucous membranes, no neck masses palpated, thyroidectomy scar inconspicuous, no cervical lymphadenopathy Cardiovascular: RRR, No MRG Respiratory: CTA B Musculoskeletal: no deformities Skin: moist, warm, no rashes Neurological: no tremor with outstretched hands  ASSESSMENT: 1.  Follicular variant of  thyroid cancer - see HPI  2. Postsurgical Hypothyroidism  PLAN:  1.  Follicular variant of papillary thyroid cancer - I had a long discussion with the patient about her recent diagnosis of thyroid cancer. We reviewed together the pathology >> she is stage 1 TNM  - I reassured her that papillary thyroid cancer is a slow growing cancer with good prognosis - she had total thyroidectomy followed by RAI treatment in 2009 - a Whole-body scan obtained after the treatment only showed uptake in the thyroid remnant, no metastasis. - a Thyrogen stimulated whole-body scan obtained a year later did not show any uptake - Her thyroglobulin levels were undetectable until 2016, after which they started to show a very slow increase.  In 2019, TSH increased from 0.1 to 0.2.  4 years later, 3 months ago, it was 0.4. - At today's visit, we discussed about repeating the thyroid ultrasound >> she agrees - if there are any suspicious masses, I will suggest biopsy and she may need surgical intervention. - I will se her back in 6 mo but possibly sooner, depending on the results  2.  Patient with h/o total thyroidectomy for cancer, now with iatrogenic hypothyroidism, on levothyroxine therapy.  - latest thyroid labs reviewed with pt. >> normal, But above our target range due to the history of cancer: Lab Results  Component Value Date   TSH 2.63 11/07/2021  - she continues on LT4 88 mcg daily - pt feels good on this dose. - we discussed about taking the thyroid hormone every day, with water, >30 minutes before breakfast, separated by >4 hours from acid reflux medications, calcium, iron, multivitamins. Pt. is not taking it correctly.  Up until 1.5 months ago, she was taking her PPI in the morning, close  to levothyroxine.  She moved it at night and only takes it as needed now.  However, she still has multivitamins and calcium that she takes approximately 30 minutes to an hour after levothyroxine we discussed about moving  these at least 4 hours later. - will check thyroid tests today per her preference: TSH and fT4 - target TSH is at the LLN or even slightly lower; I also plan to check her test again in 1.5 months after she starts taking it correctly  3. H/o hypoparathyroidism - asymptomatic  - after sx, calcium low x few checks, then normalized - latest Ca  and vit D levels normal - 10/2021  - Total time spent for the visit: 40 min, in precharting, obtaining medical information from the patient and from the chart, reviewing Dr. Cordelia Valdez last note, her  previous labs, imaging evaluations, and treatments, reviewing her symptoms, counseling her about her conditions (please see the discussed topics above), and developing a plan to further investigate and treat them; she had a number of questions which I addressed.  Component     Latest Ref Rng 01/12/2022  TSH     0.35 - 5.50 uIU/mL 3.80   T4,Free(Direct)     0.60 - 1.60 ng/dL 1.11   TSH is even higher now.  While moving calcium and multivitamins later in the day may help, I would also go ahead and increase the levothyroxine dose from 88 to 100 mcg daily and recheck her TFTs again in 1.5 months.  I would like to repeat a thyroglobulin and ATA antibodies at that time.  Neck U/S (Jan 25, 2022): Parenchymal Echotexture: 08/12/2007 preop  Isthmus: Surgically absent  Right lobe: Surgically absent  Left lobe: Surgically absent  __________________________________________________   No residual/recurrent tissue in the thyroidectomy bed. No regional cervical adenopathy noted.   IMPRESSION: No residual/recurrent tissue post thyroidectomy.   Component     Latest Ref Rng 02/23/2022  TSH     0.35 - 5.50 uIU/mL 0.54   Thyroglobulin     ng/mL 0.2 (L)   Comment --   Thyroglobulin Ab     < or = 1 IU/mL <1 point  T4,Free(Direct)     0.60 - 1.60 ng/dL 1.40   TSH is at goal. Thyroglobulin level remains detectable, slightly lower than the previous check.  I will suggest  to proceed with Thyrogen-stimulated whole-body scan to look for other possible sites of thyroglobulin production.  Philemon Kingdom, MD PhD [facility]

## 2022-01-14 ENCOUNTER — Ambulatory Visit
Admission: RE | Admit: 2022-01-14 | Discharge: 2022-01-14 | Disposition: A | Discharge status: Home / Self Care | Payer: BC Managed Care – PPO | Source: Ambulatory Visit | Attending: Internal Medicine | Admitting: Internal Medicine

## 2022-01-14 DIAGNOSIS — E890 Postprocedural hypothyroidism: Secondary | ICD-10-CM | POA: Diagnosis not present

## 2022-01-14 DIAGNOSIS — C73 Malignant neoplasm of thyroid gland: Secondary | ICD-10-CM

## 2022-01-15 ENCOUNTER — Telehealth: Payer: Self-pay

## 2022-01-15 NOTE — Telephone Encounter (Signed)
Pt called regarding her U/S results.

## 2022-01-15 NOTE — Telephone Encounter (Signed)
I sent her a message with the results.

## 2022-01-16 NOTE — Telephone Encounter (Signed)
Pt contacted and advised of results. 

## 2022-02-06 ENCOUNTER — Ambulatory Visit: Payer: BC Managed Care – PPO | Admitting: Endocrinology

## 2022-02-19 DIAGNOSIS — M25562 Pain in left knee: Secondary | ICD-10-CM | POA: Diagnosis not present

## 2022-02-23 ENCOUNTER — Other Ambulatory Visit (INDEPENDENT_AMBULATORY_CARE_PROVIDER_SITE_OTHER): Payer: Self-pay

## 2022-02-23 DIAGNOSIS — C73 Malignant neoplasm of thyroid gland: Secondary | ICD-10-CM | POA: Diagnosis not present

## 2022-02-23 DIAGNOSIS — E890 Postprocedural hypothyroidism: Secondary | ICD-10-CM

## 2022-02-23 LAB — TSH: TSH: 0.54 u[IU]/mL (ref 0.35–5.50)

## 2022-02-23 LAB — T4, FREE: Free T4: 1.4 ng/dL (ref 0.60–1.60)

## 2022-02-25 LAB — THYROGLOBULIN ANTIBODY: Thyroglobulin Ab: <1 IU/mL (ref <=1)

## 2022-02-25 LAB — THYROGLOBULIN LEVEL: Thyroglobulin: 0.2 ng/mL — ABNORMAL LOW

## 2022-02-26 ENCOUNTER — Telehealth: Payer: Self-pay

## 2022-02-26 NOTE — Telephone Encounter (Signed)
Pt called to discuss lab results.

## 2022-02-26 NOTE — Telephone Encounter (Signed)
I did not have a chance to review them.

## 2022-02-26 NOTE — Addendum Note (Signed)
Addended by: Philemon Kingdom on: 02/26/2022 12:28 PM   Modules accepted: Orders

## 2022-02-27 DIAGNOSIS — M25562 Pain in left knee: Secondary | ICD-10-CM | POA: Diagnosis not present

## 2022-02-27 NOTE — Telephone Encounter (Signed)
Pt called and was advised The thyroid function test (TSH) is at goal, please continue the same dose of levothyroxine. Thyroglobulin (the tumor marker level) is slightly lower than before, but remains detectable.  I will suggest to proceed with a  whole-body scan to look for other possible sites of thyroglobulin production.  I ordered this test to be done at [facility].  If she is not called about it within the next week, please have her call  [phone].  She needs to an injection before the actual scan but they will coordinate this with her.

## 2022-03-05 DIAGNOSIS — R5383 Other fatigue: Secondary | ICD-10-CM | POA: Diagnosis not present

## 2022-03-05 DIAGNOSIS — F419 Anxiety disorder, unspecified: Secondary | ICD-10-CM | POA: Diagnosis not present

## 2022-03-05 DIAGNOSIS — M25561 Pain in right knee: Secondary | ICD-10-CM | POA: Diagnosis not present

## 2022-03-05 DIAGNOSIS — E559 Vitamin D deficiency, unspecified: Secondary | ICD-10-CM | POA: Diagnosis not present

## 2022-03-05 DIAGNOSIS — Z85850 Personal history of malignant neoplasm of thyroid: Secondary | ICD-10-CM | POA: Diagnosis not present

## 2022-03-05 NOTE — Written Directive (Addendum)
MOLECULAR IMAGING AND THERAPEUTICS WRITTEN DIRECTIVE   PATIENT NAME: Vickie Valdez  PT DOB:   Jan 24, 1956                                              MRN: [id number]  ---------------------------------------------------------------------------------------------------------------------  I-131 WHOLE BODY SCAN    RADIOPHARMACEUTICAL: Iodine-131 Capsule for Diagnostic Imaging   PRESCRIBED DOSE FOR ADMINISTRATION:  4 mCi   ROUTE OFADMINISTRATION: PO   DIAGNOSIS: malignant neoplasm of thyroid   REFERRING PHYSICIAN: Dr. Benjiman Core   THYROGEN STIMULATION OR HORMONE WITHDRAW:   DATE OF THYROIDECTOMY: 03/22/2008   SURGEON:   TSH:   Lab Results  Component Value Date   TSH 0.54 02/23/2022   TSH 3.80 01/12/2022   TSH 2.63 11/07/2021     PRIOR I-131 THERAPY (Date and Dose):   ADDITIONAL PHYSICIAN COMMENTS/NOTES   AUTHORIZED USER SIGNATURE & TIME STAMP: Rennis Golden, MD   03/05/22    11:40 AM

## 2022-03-12 ENCOUNTER — Telehealth: Payer: Self-pay | Admitting: Internal Medicine

## 2022-03-12 DIAGNOSIS — E890 Postprocedural hypothyroidism: Secondary | ICD-10-CM

## 2022-03-12 DIAGNOSIS — C73 Malignant neoplasm of thyroid gland: Secondary | ICD-10-CM

## 2022-03-12 MED ORDER — LEVOTHYROXINE SODIUM 50 MCG PO TABS: 100.0000 ug | ORAL_TABLET | Freq: Every day | ORAL | 3 refills | Status: DC

## 2022-03-12 NOTE — Telephone Encounter (Signed)
T, This may be a reaction to the dyes in the tablet - let's have her take 2 tablets of 50 mcg daily in am to replace her 100 mcg tablet. Please send a 3 mo supply of 180 tablets with 3 refills. These do not have any dyes. If sxs persist after this change, may need to contact PCP.

## 2022-03-12 NOTE — Telephone Encounter (Signed)
Patient called to advise that since her dose changed for Levothyroxine she has been experiencing cramps in left foot, light headedness, feeling overheated, tingling in lips.  Wants to come in to see Dr Cruzita Lederer earlier than 07/10/22 follow up visit. Call back number is [phone]

## 2022-03-12 NOTE — Telephone Encounter (Signed)
Pt advised of provider's recommendation.

## 2022-03-23 ENCOUNTER — Encounter (HOSPITAL_COMMUNITY)
Admission: RE | Admit: 2022-03-23 | Discharge: 2022-03-23 | Disposition: A | Discharge status: Home / Self Care | Payer: BC Managed Care – PPO | Source: Ambulatory Visit | Attending: Internal Medicine | Admitting: Internal Medicine

## 2022-03-23 DIAGNOSIS — C73 Malignant neoplasm of thyroid gland: Secondary | ICD-10-CM | POA: Diagnosis not present

## 2022-03-23 MED ORDER — THYROTROPIN ALFA 0.9 MG IM SOLR
0.9000 mg | INTRAMUSCULAR | Status: AC
  Administered 2022-03-23: 0.9 mg via INTRAMUSCULAR

## 2022-03-23 MED ORDER — THYROTROPIN ALFA 0.9 MG IM SOLR
INTRAMUSCULAR | Status: AC
  Filled 2022-03-23: qty 0.9

## 2022-03-24 ENCOUNTER — Telehealth: Payer: Self-pay

## 2022-03-24 ENCOUNTER — Encounter (HOSPITAL_COMMUNITY)
Admission: RE | Admit: 2022-03-24 | Discharge: 2022-03-24 | Disposition: A | Discharge status: Home / Self Care | Payer: BC Managed Care – PPO | Source: Ambulatory Visit | Attending: Internal Medicine | Admitting: Internal Medicine

## 2022-03-24 DIAGNOSIS — C73 Malignant neoplasm of thyroid gland: Secondary | ICD-10-CM | POA: Diagnosis not present

## 2022-03-24 MED ORDER — THYROTROPIN ALFA 0.9 MG IM SOLR
INTRAMUSCULAR | Status: AC
  Administered 2022-03-24: 0.9 mg via INTRAMUSCULAR
  Filled 2022-03-24: qty 0.9

## 2022-03-24 MED ORDER — THYROTROPIN ALFA 0.9 MG IM SOLR: 0.9000 mg | INTRAMUSCULAR | Status: AC

## 2022-03-24 NOTE — Telephone Encounter (Signed)
Pt called requesting a rx be called into the pharmacy for her MRI Thursday. Pt states she is claustrophobic and needs something to help with her nerves.

## 2022-03-25 ENCOUNTER — Encounter (HOSPITAL_COMMUNITY)
Admission: RE | Admit: 2022-03-25 | Discharge: 2022-03-25 | Disposition: A | Discharge status: Home / Self Care | Payer: BC Managed Care – PPO | Source: Ambulatory Visit | Attending: Internal Medicine | Admitting: Internal Medicine

## 2022-03-25 ENCOUNTER — Other Ambulatory Visit: Payer: Self-pay | Admitting: Internal Medicine

## 2022-03-25 MED ORDER — SODIUM IODIDE I 131 CAPSULE
4.1200 | Freq: Once | INTRAVENOUS | Status: AC | PRN
  Administered 2022-03-25: 4.12 via ORAL

## 2022-03-25 MED ORDER — LORAZEPAM 0.5 MG PO TABS: 0.5000 mg | ORAL_TABLET | ORAL | 0 refills | Status: AC | PRN

## 2022-03-25 NOTE — Telephone Encounter (Signed)
I sent 4 tablet of Ativan - only take if not also taking Librax (on her medication list).  She can take 1-2 naproxen roughly 30 minutes before the study and, if needed, she can take 1-2 beforehand.

## 2022-03-26 NOTE — Telephone Encounter (Signed)
ATC pt to advise and mailbox was full.

## 2022-03-27 ENCOUNTER — Encounter (HOSPITAL_COMMUNITY)
Admission: RE | Admit: 2022-03-27 | Discharge: 2022-03-27 | Disposition: A | Discharge status: Home / Self Care | Payer: BC Managed Care – PPO | Source: Ambulatory Visit | Attending: Internal Medicine | Admitting: Internal Medicine

## 2022-03-27 DIAGNOSIS — C73 Malignant neoplasm of thyroid gland: Secondary | ICD-10-CM | POA: Diagnosis not present

## 2022-03-30 ENCOUNTER — Telehealth: Payer: Self-pay | Admitting: Internal Medicine

## 2022-03-30 ENCOUNTER — Telehealth: Payer: Self-pay

## 2022-03-30 ENCOUNTER — Other Ambulatory Visit: Payer: BC Managed Care – PPO

## 2022-03-30 NOTE — Telephone Encounter (Signed)
Pt called requesting results from pet scan.

## 2022-03-30 NOTE — Telephone Encounter (Signed)
Patient is calling for results from the scan that she had done on Friday, 03/27/2022 that was ordered by Dr. Cruzita Lederer.

## 2022-03-30 NOTE — Telephone Encounter (Signed)
I only had time to look at it: It did not show any abnormal masses in neck or the rest of the body!

## 2022-03-31 NOTE — Telephone Encounter (Signed)
Patient informed to contact PCP

## 2022-03-31 NOTE — Telephone Encounter (Signed)
Patient has now been informed of scan results and expresses her understanding. She also wanted to know if she is able to have a genetic scan test for breast cancer performed of if she will need to reach out to her PCP

## 2022-03-31 NOTE — Telephone Encounter (Signed)
Yes, per PCP

## 2022-03-31 NOTE — Telephone Encounter (Signed)
Pt contacted and advised Pet Scan did not show any abnormal masses in neck or the rest of the body!

## 2022-04-02 ENCOUNTER — Ambulatory Visit: Payer: BC Managed Care – PPO | Admitting: Endocrinology

## 2022-04-14 ENCOUNTER — Ambulatory Visit: Payer: BC Managed Care – PPO | Admitting: Internal Medicine

## 2022-05-08 DIAGNOSIS — Z23 Encounter for immunization: Secondary | ICD-10-CM | POA: Diagnosis not present

## 2022-05-08 DIAGNOSIS — E559 Vitamin D deficiency, unspecified: Secondary | ICD-10-CM | POA: Diagnosis not present

## 2022-05-08 DIAGNOSIS — E7800 Pure hypercholesterolemia, unspecified: Secondary | ICD-10-CM | POA: Diagnosis not present

## 2022-05-08 DIAGNOSIS — K589 Irritable bowel syndrome without diarrhea: Secondary | ICD-10-CM | POA: Diagnosis not present

## 2022-05-08 DIAGNOSIS — Z0000 Encounter for general adult medical examination without abnormal findings: Secondary | ICD-10-CM | POA: Diagnosis not present

## 2022-05-12 DIAGNOSIS — M545 Low back pain, unspecified: Secondary | ICD-10-CM | POA: Diagnosis not present

## 2022-05-12 DIAGNOSIS — M25552 Pain in left hip: Secondary | ICD-10-CM | POA: Diagnosis not present

## 2022-05-13 DIAGNOSIS — M5416 Radiculopathy, lumbar region: Secondary | ICD-10-CM | POA: Diagnosis not present

## 2022-05-25 DIAGNOSIS — M5416 Radiculopathy, lumbar region: Secondary | ICD-10-CM | POA: Diagnosis not present

## 2022-05-28 DIAGNOSIS — M5416 Radiculopathy, lumbar region: Secondary | ICD-10-CM | POA: Diagnosis not present

## 2022-06-04 DIAGNOSIS — M5416 Radiculopathy, lumbar region: Secondary | ICD-10-CM | POA: Diagnosis not present

## 2022-06-12 ENCOUNTER — Ambulatory Visit: Payer: BC Managed Care – PPO | Admitting: Internal Medicine

## 2022-06-12 ENCOUNTER — Encounter: Payer: Self-pay | Admitting: Internal Medicine

## 2022-06-12 VITALS — BP 124/78 | HR 63 | Ht 63.0 in | Wt 164.4 lb

## 2022-06-12 DIAGNOSIS — C73 Malignant neoplasm of thyroid gland: Secondary | ICD-10-CM | POA: Diagnosis not present

## 2022-06-12 DIAGNOSIS — E209 Hypoparathyroidism, unspecified: Secondary | ICD-10-CM

## 2022-06-12 DIAGNOSIS — E890 Postprocedural hypothyroidism: Secondary | ICD-10-CM

## 2022-06-12 DIAGNOSIS — Z23 Encounter for immunization: Secondary | ICD-10-CM | POA: Diagnosis not present

## 2022-06-12 LAB — VITAMIN D 25 HYDROXY (VIT D DEFICIENCY, FRACTURES): VITD: 40.31 ng/mL (ref 30.00–100.00)

## 2022-06-12 LAB — TSH: TSH: 0.22 u[IU]/mL — ABNORMAL LOW (ref 0.35–5.50)

## 2022-06-12 LAB — T4, FREE: Free T4: 1.36 ng/dL (ref 0.60–1.60)

## 2022-06-12 NOTE — Progress Notes (Unsigned)
Patient ID: Vickie Valdez, female   DOB: 05/08/1956, 66 y.o.   MRN: [id number]  HPI  Vickie Valdez is a 66 y.o.-year-old female, returning for follow-up for thyroid cancer, postsurgical hypothyroidism.  She previously saw Dr. Loanne Drilling, but last visit with me 5 months ago.  Interim hx: She had tongue tingling and feeling poorly 2/2 dye in the 100 mcg levothyroxine tab >> now resolved after switching to 2x 50 mcg tabs. She otherwise feels well, without complaints.  Pt. has been dx with follicular variant of papillary thyroid cancer in 2009.   Reviewed patient's previous history per Epic records and Dr. Cordelia Pen notes; 6/09: thyroidectomy for MNG >> a 1 cm follicular variant of papillary thyroid cancer (margins clear, T1 N0 M0).    8/09: RAI I131 104 mCi 8/09: post-therapy WBS pos only at the neck.   1/10: thyrogen-stim WBS neg  Since last visit, she had the following tests: Neck U/S (01/14/2022): Parenchymal Echotexture: 08/12/2007 preop  Isthmus: Surgically absent  Right lobe: Surgically absent  Left lobe: Surgically absent  __________________________________________________   No residual/recurrent tissue in the thyroidectomy bed. No regional cervical adenopathy noted.   IMPRESSION: No residual/recurrent tissue post thyroidectomy.  PET scan (03/23/2022): No signs to suggest residual/recurrent functioning thyroid tissue.   Review thyroglobulin levels: Lab Results  Component Value Date   THYROGLB 0.2 (L) 02/23/2022   THYROGLB 0.4 (L) 11/07/2021   THYROGLB 0.2 (L) 02/25/2021   THYROGLB 0.2 (L) 02/03/2021   THYROGLB 0.2 (L) 10/22/2020   THYROGLB 0.2 (L) 02/02/2020   THYROGLB 0.2 (L) 01/20/2019   THYROGLB 0.2 (L) 04/15/2018   THYROGLB 0.2 (L) 07/23/2017   THYROGLB 0.2 (L) 04/22/2017   THYROGLB 0.2 (L) 01/01/2017   THYROGLB 0.1 (L) 12/26/2014   THYROGLB <0.2 12/15/2013   THYROGLB <0.2 12/16/2012   THYROGLB <0.2 12/11/2011   THYROGLB <0.2 07/27/2011   THYROGLB  <0.2 12/11/2010   THGAB <1 02/23/2022   THGAB <1 11/07/2021   THGAB <1 02/25/2021   THGAB <1 02/03/2021   THGAB <1 10/22/2020   THGAB <1 02/02/2020   THGAB <1 01/20/2019   THGAB <1 04/15/2018   THGAB <1 07/23/2017   THGAB <1 04/22/2017   THGAB <1 01/01/2017   THGAB <1 12/26/2014   THGAB <20.0 12/15/2013   THGAB <20.0 12/16/2012   THGAB <20.0 12/11/2011   THGAB <20.0 07/27/2011   THGAB <20.0 12/11/2010   THGAB <20.0 12/11/2010   THGAB <30.0 U/mL 12/10/2009  5/11:  tg undetectable (ab neg) 11/10: tg undetectable (ab neg) 5/10: tg undetectable (ab neg) 1/10:  tg undetectable (ab neg)  Pt denies: - feeling nodules in neck - hoarseness - dysphagia - choking  For her postsurgical hypothyroidism, she is on Levothyroxine 100 mcg (dose increased 01/2022) - 2x50 mcg (intolerance to the dye), taken: - in am - fasting - at least 30 min from b'fast - + calcium <1h after LT4 >> moved to more than 4 hours later - no iron - + multivitamins  <1h after LT4  >> moved to more than 4 hours later - + PPIs (Omeprazole) - moved at night  (prn), prev. in am - not on Biotin  I reviewed pt's thyroid tests: Lab Results  Component Value Date   TSH 0.54 02/23/2022   TSH 3.80 01/12/2022   TSH 2.63 11/07/2021   TSH 1.15 02/25/2021   TSH 0.17 (L) 02/03/2021   TSH 0.70 10/22/2020   TSH 0.43 02/02/2020   TSH 0.11 (L) 01/20/2019   TSH 1.24 04/15/2018  TSH 2.00 07/23/2017   FREET4 1.40 02/23/2022   FREET4 1.11 01/12/2022   FREET4 1.05 11/07/2021   FREET4 1.13 02/25/2021   FREET4 1.22 02/03/2021   FREET4 1.17 10/22/2020   FREET4 1.5 02/02/2020   FREET4 1.4 01/20/2019   She mentions hot flushes, but no other sxs.  She has a history of postsurgical hypocalcemia: - resolved  Reviewed PTH and calcium levels: Lab Results  Component Value Date   PTH 25 11/07/2021   PTH 25 10/22/2020   PTH 20 02/02/2020   PTH 21 01/20/2019   PTH 22 04/15/2018   PTH 29 04/28/2017   PTH CANCELED  04/22/2017   PTH 35.3 12/16/2012   PTH 34.9 07/27/2011   PTH 30.9 12/11/2010   PTH 32.9 12/10/2009   PTH 18.3 06/20/2009   PTH 34.5 08/16/2008   PTH 26.1 05/23/2008   CALCIUM 8.9 11/07/2021   CALCIUM 8.8 10/22/2020   CALCIUM 8.9 10/22/2020   CALCIUM 9.1 02/02/2020   CALCIUM 8.9 01/20/2019   CALCIUM 9.2 04/15/2018   CALCIUM 9.0 04/28/2017   CALCIUM 9.0 04/22/2017   CALCIUM 9.1 12/16/2012   CALCIUM 8.8 07/27/2011   CALCIUM 8.9 12/11/2010   CALCIUM 8.2 (L) 12/10/2009   CALCIUM 8.7 06/20/2009   CALCIUM 8.7 08/16/2008   CALCIUM 9.1 05/23/2008   CALCIUM 7.0 (L) 02/11/2008   CALCIUM 7.3 (L) 02/10/2008   CALCIUM 7.9 (L) 02/09/2008   CALCIUM 9.4 02/06/2008   Reviewed vitamin D levels: Lab Results  Component Value Date   VD25OH 37.97 11/07/2021   VD25OH 56.33 02/03/2021   VD25OH 51.75 10/22/2020   VD25OH 23 (L) 02/02/2020   VD25OH 47 01/20/2019   VD25OH 38.79 04/15/2018   VD25OH 50.68 07/23/2017   VD25OH 25.81 (L) 04/22/2017   VD25OH 33 12/15/2013  She takes 1000 units vitamin D daily.  She has + FH of thyroid disorders in: sister - HTyr. No FH of thyroid cancer.  No h/o radiation tx to head or neck.  She also has a history of IBS, GERD, obesity.  ROS: + see HPI  Past Medical History:  Diagnosis Date   Allergic rhinitis    GERD 11/08/2007   Hypoparathyroidism ([place]) 02/20/2008   HYPOTHYROIDISM, POSTSURGICAL 02/20/2008   IBS (irritable bowel syndrome)    Multinodular goiter    Obesity    Other dysphagia 11/09/2007   THYROID CANCER 02/20/2008   stage 1 papillary adenocarcinoma, follicular variant 22/63: thyroidectomy for 38m papillary ca, follicular variant 033/54 104 mci I-131 rx , post-therapy scan pos only at the neck 1/10: tg undectectable (ab neg) neg thyrogen scan 05/10: tg undetectable (ab neg) 11/10: tg undetectable (ab neg) 5/11: tg undectectable (ab neg)   Past Surgical History:  Procedure Laterality Date   BIOPSY THYROID  10/14/2007   Thyroid Ultrasound   08/12/2007   Thyroidecctomy     Social History   Socioeconomic History   Marital status: Married    Spouse name: Not on file   Number of children: Not on file   Years of education: Not on file   Highest education level: Not on file  Occupational History   Occupation: AScientist, physiological    Comment: [employer]  Tobacco Use   Smoking status: Never   Smokeless tobacco: Never  Substance and Sexual Activity   Alcohol use: No   Drug use: No   Sexual activity: Not on file  Other Topics Concern   Not on file  Social History Narrative   Regular exercise-yes   Social Determinants  of Health   Financial Resource Strain: Not on file  Food Insecurity: Not on file  Transportation Needs: Not on file  Physical Activity: Not on file  Stress: Not on file  Social Connections: Not on file  Intimate Partner Violence: Not on file   Current Outpatient Medications on File Prior to Visit  Medication Sig Dispense Refill   busPIRone (BUSPAR) 7.5 MG tablet Take 7.5 mg by mouth 2 (two) times daily.      Calcium Carb-Cholecalciferol (CALCIUM 600 + D PO) Take 1 tablet by mouth daily.     Cholecalciferol (VITAMIN D-3) 5000 UNIT/ML LIQD 1 tablet     clidinium-chlordiazePOXIDE (LIBRAX) 2.5-5 MG per capsule Take 1 capsule by mouth 4 (four) times daily as needed.     diclofenac (VOLTAREN) 75 MG EC tablet Take 1 tablet (75 mg total) by mouth 2 (two) times daily. 60 tablet 1   estradiol (ESTRACE) 0.1 MG/GM vaginal cream Place 1 Applicatorful vaginally 3 (three) times a week.     gabapentin (NEURONTIN) 300 MG capsule gabapentin 300 mg capsule  TAKE 1 CAPSULE BY MOUTH EVERYDAY AT BEDTIME     hyoscyamine (LEVSIN, ANASPAZ) 0.125 MG tablet      levothyroxine (SYNTHROID) 100 MCG tablet Take 1 tablet (100 mcg total) by mouth daily. 45 tablet 3   levothyroxine (SYNTHROID) 50 MCG tablet Take 2 tablets (100 mcg total) by mouth daily. 180 tablet 3   LORazepam (ATIVAN) 0.5 MG tablet Take 1-2 tablets (0.5-1 mg total)  by mouth every 4 (four) hours as needed for anxiety. 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 Only take if not taking Librax. 4 tablet 0   Multiple Vitamin (MULTIVITAMIN) tablet Take 1 tablet by mouth daily.     omeprazole (PRILOSEC) 20 MG capsule Take 20 mg by mouth daily.      No current facility-administered medications on file prior to visit.   Allergies  Allergen Reactions   Aspirin     Upset GI System   Prednisone     Per pt "makes her feel strange"   Penicillins Rash   Family History  Problem Relation Age of Onset   Thyroid disease Sister        Hyperthyroidism   PE: BP 124/78 (BP Location: Right Arm, Patient Position: Sitting, Cuff Size: Normal)   Ht '5\' 3"'$  (1.6 m)   Wt 164 lb 6.4 oz (74.6 kg)   BMI 29.12 kg/m  Wt Readings from Last 3 Encounters:  06/12/22 164 lb 6.4 oz (74.6 kg)  01/12/22 165 lb 6.4 oz (75 kg)  11/07/21 168 lb 12.8 oz (76.6 kg)   Constitutional: overweight, in NAD Eyes: no exophthalmos ENT: no neck masses palpated, thyroidectomy scar inconspicuous, no cervical lymphadenopathy Cardiovascular: RRR, No MRG Respiratory: CTA B Musculoskeletal: no deformities Skin: moist, warm, no rashes Neurological: no tremor with outstretched hands  ASSESSMENT: 1.  Follicular variant of thyroid cancer - see HPI  2. Postsurgical Hypothyroidism  PLAN:  1.  Follicular variant of papillary thyroid cancer - We reviewed together the pathology  -she is stage I TNM - I reassured her that papillary thyroid cancer is a slow growing cancer with good prognosis -She had total thyroidectomy followed by RAI treatment in 2009 -Whole-body scan obtained after treatment only showed uptake in the thyroid remnant, no metastasis -Thyrogen stimulated whole-body scan obtained 1 year later did not show any uptake - Her thyroglobulin levels were undetectable until 2016, after which they started to show a very slow increase.  In 2019, Tg increased from 0.1 to 0.2.  4  years later it was 0.4.  However, at last visit, it decreased to 0.2. - We reviewed her thyroid ultrasound from last visit: No concerning masses in the neck - We proceeded with a PET scan and this did not show abnormal uptake - Therefore, for now, we will recheck her thyroglobulin and ATA to monitor trend, but no intervention is needed for now - I will se her back in 1 year  2.  Patient with h/o total thyroidectomy for cancer, now with iatrogenic hypothyroidism, on levothyroxine therapy.  - latest thyroid labs reviewed with pt. >> normal: Lab Results  Component Value Date   TSH 0.54 02/23/2022  - she continues on LT4 100 mcg daily, increased at last visit -she had tongue tingling and feeling poorly after switching to the 100 mcg LT4 tablet, but this resolved after we switched to 2x 50 mcg tablets. - pt feels good on this dose. - we discussed about taking the thyroid hormone every day, with water, >30 minutes before breakfast, separated by >4 hours from acid reflux medications, calcium, iron, multivitamins. Pt. is taking it correctly now -at last visit, she was taking calcium and multivitamins too close to levothyroxine so we moved them at least 4 hours later. - will check thyroid tests today: TSH and fT4 - If labs are abnormal, she will need to return for repeat TFTs in 1.5 months  3. H/o hypoparathyroidism -Asymptomatic - after sx, calcium low x few checks, then normalized -Latest calcium and vitamin D levels were normal in 10/2021 -She continues on 1000 units vitamin D daily -We will recheck a vitamin D level today  + flu shot today  Philemon Kingdom, MD PhD [facility] Endocrinology

## 2022-06-12 NOTE — Patient Instructions (Signed)
Please continue Levothyroxine 100 mcg daily. ° °Take the thyroid hormone every day, with water, at least 30 minutes before breakfast, separated by at least 4 hours from: °- acid reflux medications °- calcium °- iron °- multivitamins ° °Please stop at the lab. ° °Please return in 1 year.  ° °

## 2022-06-15 LAB — THYROGLOBULIN LEVEL: Thyroglobulin: 0.2 ng/mL — ABNORMAL LOW

## 2022-06-15 LAB — THYROGLOBULIN ANTIBODY: Thyroglobulin Ab: <1 IU/mL (ref <=1)

## 2022-06-24 ENCOUNTER — Ambulatory Visit: Payer: BC Managed Care – PPO | Admitting: Podiatry

## 2022-06-24 ENCOUNTER — Encounter: Payer: Self-pay | Admitting: Podiatry

## 2022-06-24 DIAGNOSIS — M7752 Other enthesopathy of left foot: Secondary | ICD-10-CM

## 2022-06-24 DIAGNOSIS — Q828 Other specified congenital malformations of skin: Secondary | ICD-10-CM | POA: Diagnosis not present

## 2022-06-25 NOTE — Progress Notes (Signed)
Subjective:   Patient ID: Vickie Valdez, female   DOB: 66 y.o.   MRN: [id number]   HPI Patient presents with discomfort in both feet with inflammation in the left forefoot that is moderately painful when pressed   ROS      Objective:  Physical Exam  Chronic inflammation forefoot left over right also has lesions which can be contributory to the discomfort she experiences     Assessment:  Inflammatory capsulitis along with lesion formation creating forefoot discomfort left over right     Plan:  H&P reviewed condition and I do not recommend currently injections but may be necessary at next visit we discussed shoe gear modifications with more rigid bottom shoes I debrided lesions courtesy and patient will be seen back to recheck as needed and may again require more aggressive treatment at next visit

## 2022-07-10 ENCOUNTER — Ambulatory Visit: Payer: BC Managed Care – PPO | Admitting: Internal Medicine

## 2022-08-07 ENCOUNTER — Other Ambulatory Visit: Payer: Self-pay

## 2022-08-07 DIAGNOSIS — C73 Malignant neoplasm of thyroid gland: Secondary | ICD-10-CM

## 2022-08-07 MED ORDER — LEVOTHYROXINE SODIUM 100 MCG PO TABS: 100.0000 ug | ORAL_TABLET | Freq: Every day | ORAL | 3 refills | Status: DC

## 2022-11-20 IMAGING — US US THYROID
1 series · 14 of 22 positions shown · non-contrast
Comparison: None Available.

CLINICAL DATA: Thyroid carcinoma post thyroidectomy, radioiodine

EXAM:
THYROID ULTRASOUND
TECHNIQUE: Ultrasound examination of the thyroidectomy bed and adjacent soft
tissues was performed.

[Series 1: us thyroid · 0.05mm/px · 14 of 22 slices shown]
[im 1/22]
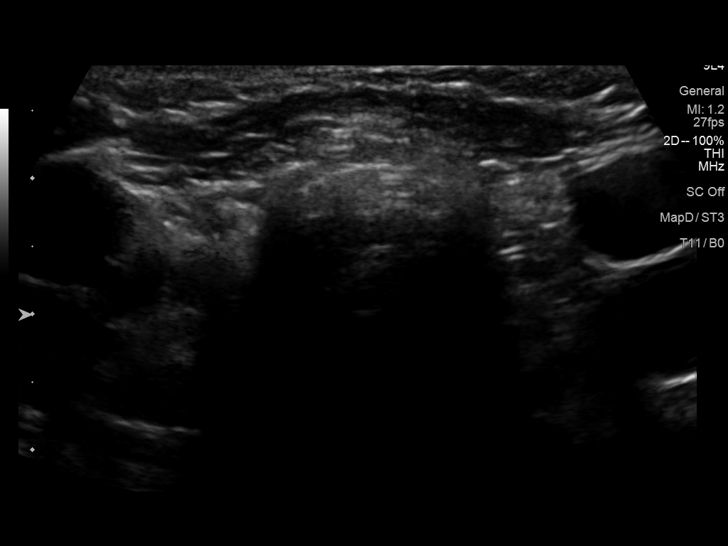
[im 3/22]
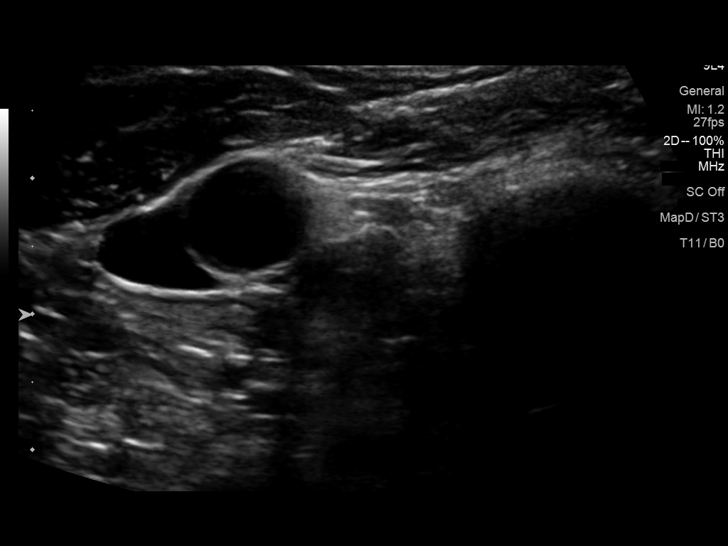
[im 4/22]
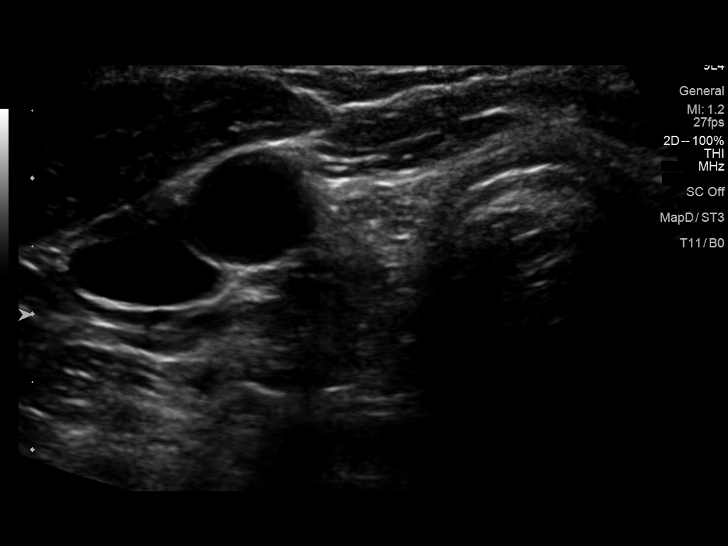
[im 6/22]
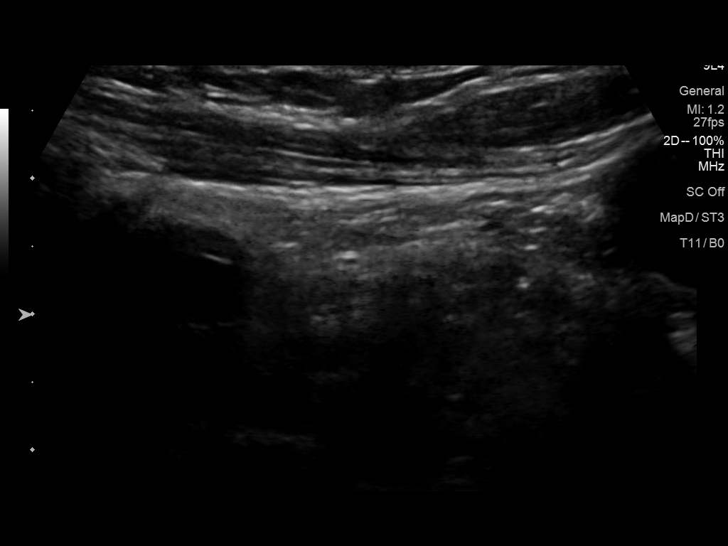
[im 8/22]
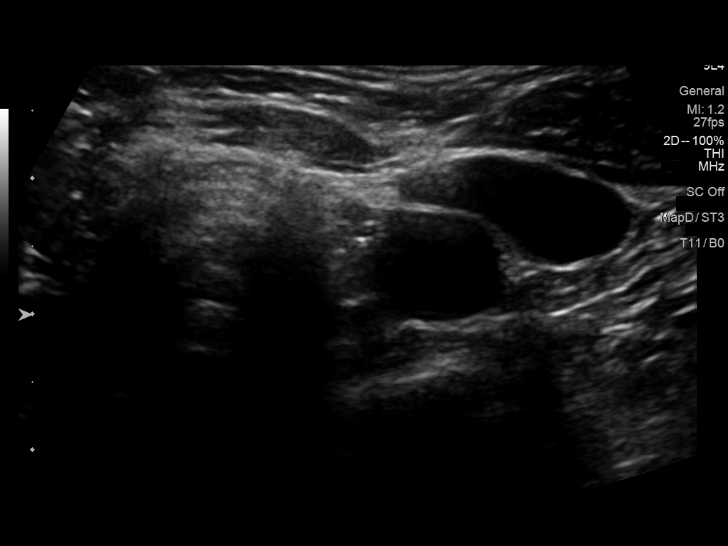
[im 9/22]
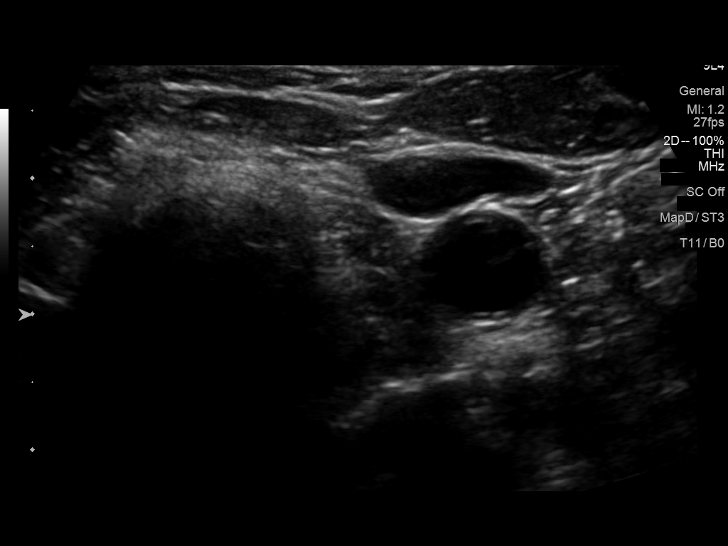
[im 11/22]
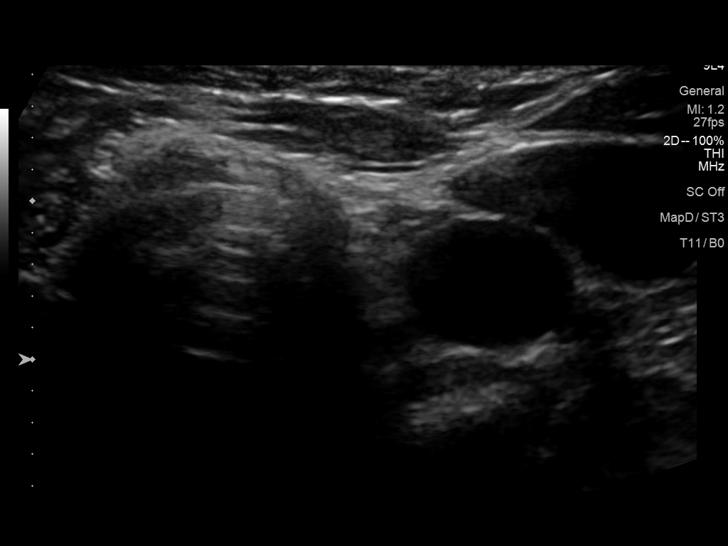
[im 12/22]
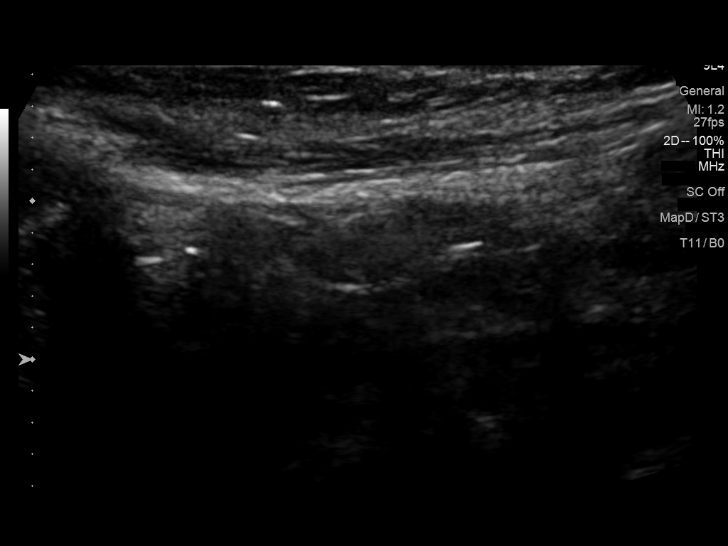
[im 14/22]
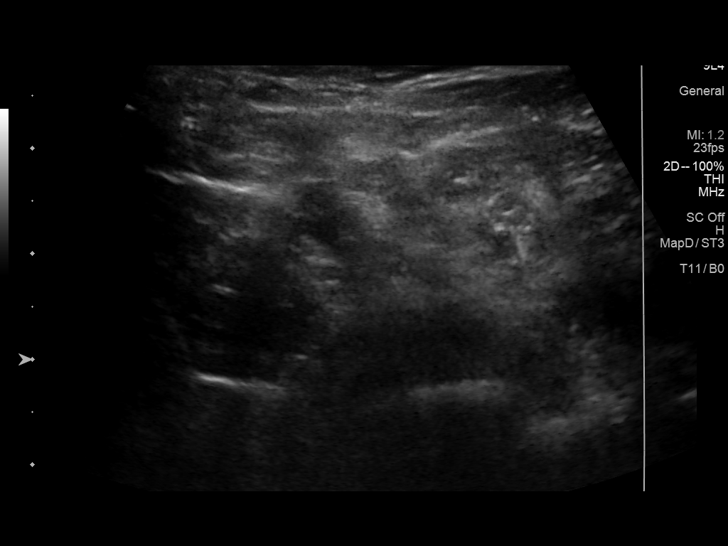
[im 15/22]
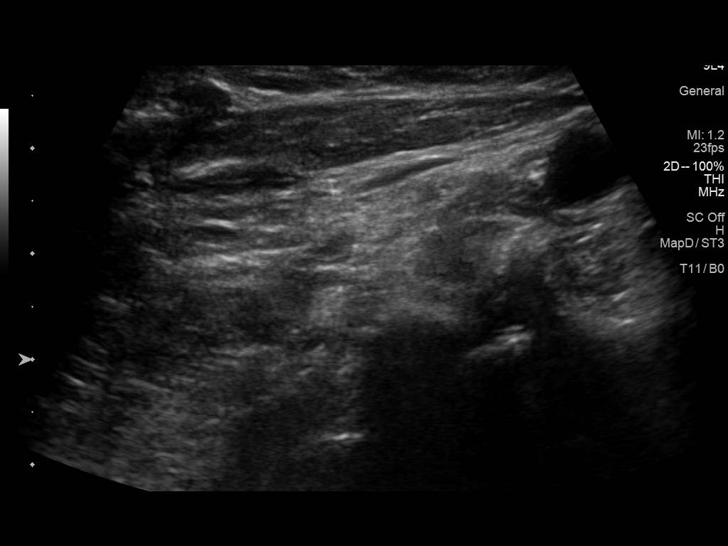
[im 17/22]
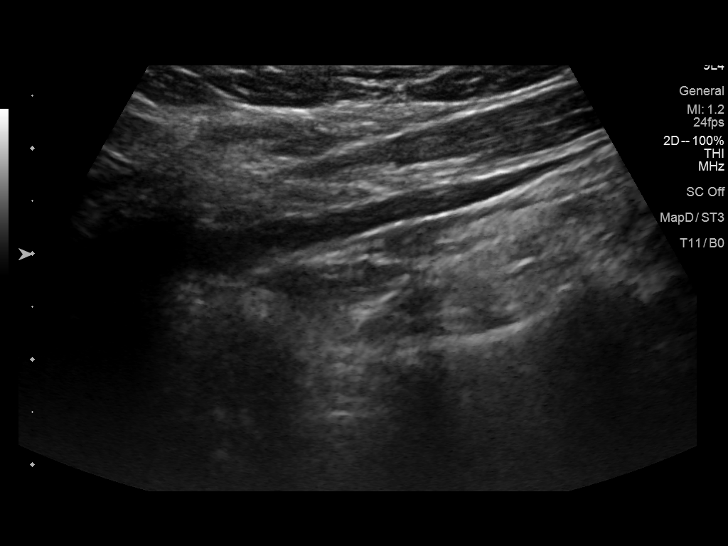
[im 19/22]
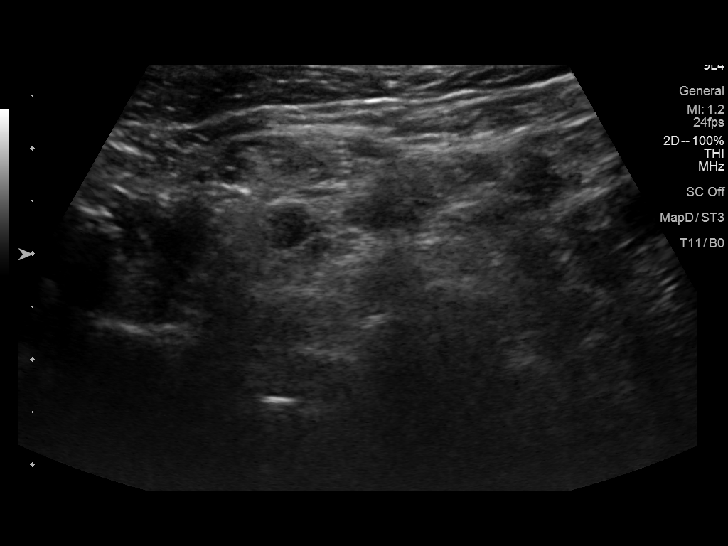
[im 20/22]
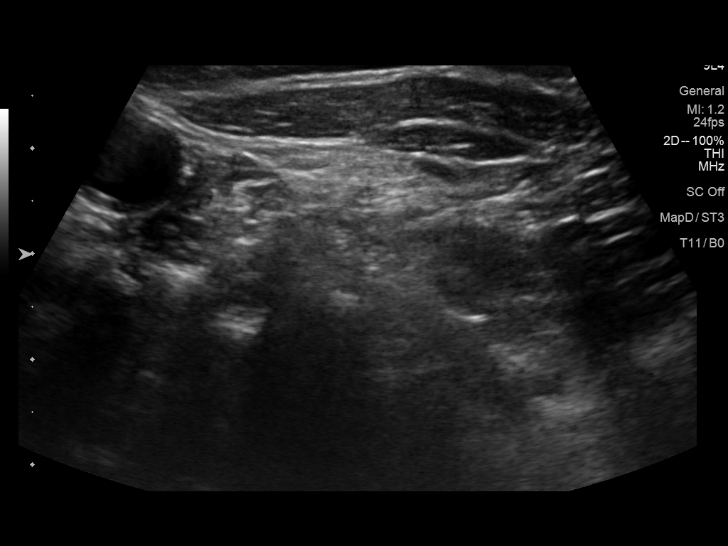
[im 22/22]
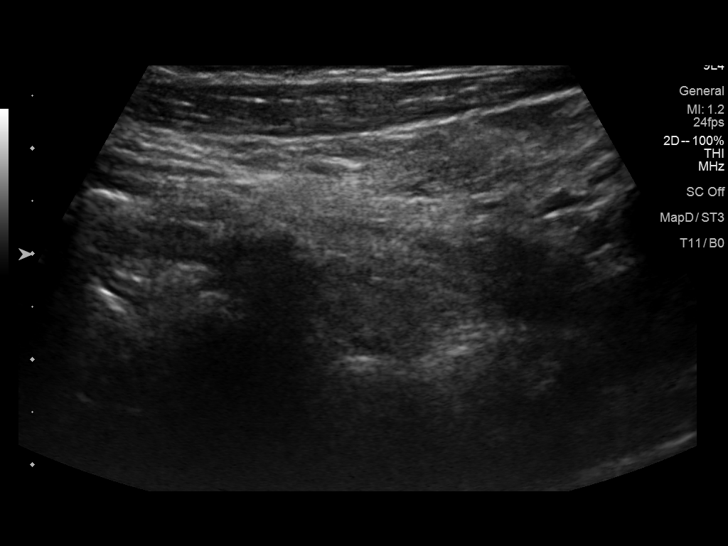

[14 of 22 positions shown; findings below may reference images not displayed]

FINDINGS: Parenchymal Echotexture: 08/12/2007 preop

Isthmus: Surgically absent

Right lobe: Surgically absent

Left lobe: Surgically absent

_________________________________________________________

Estimated total number of nodules >/= 1 cm: 0

Number of spongiform nodules >/=  2 cm not described below (TR1): 0

Number of mixed cystic and solid nodules >/= 1.5 cm not described
below (TR2): 0

_________________________________________________________

No residual/recurrent tissue in the thyroidectomy bed. No regional
cervical adenopathy noted.
IMPRESSION: No residual/recurrent tissue post thyroidectomy.

The above is in keeping with the ACR TI-RADS recommendations - [HOSPITAL] 3409;[DATE].

## 2022-12-02 ENCOUNTER — Ambulatory Visit: Payer: BC Managed Care – PPO | Admitting: Podiatry

## 2022-12-02 DIAGNOSIS — M7752 Other enthesopathy of left foot: Secondary | ICD-10-CM

## 2022-12-02 DIAGNOSIS — Q828 Other specified congenital malformations of skin: Secondary | ICD-10-CM

## 2022-12-04 NOTE — Progress Notes (Signed)
Subjective:   Patient ID: Vickie Valdez, female   DOB: 67 y.o.   MRN: [id number]   HPI Patient presents with left foot callus and also needs her orthotics   ROS      Objective:  Physical Exam  Neurovascular status intact with patient found to have inflammation around the fifth metatarsal head left over right with lesion formation pain and also moderate changes around the first MPJ bilateral     Assessment:  Callus formation bilateral moderate functional hallux limitus deformity bilateral     Plan:  H&P reviewed recommended orthotics and scanned for customized orthotic devices to reduce stress debrided lesions courtesy no angiogenic bleeding reappoint as needed routine care and for orthotic fitting

## 2022-12-28 ENCOUNTER — Ambulatory Visit: Payer: BC Managed Care – PPO | Admitting: Internal Medicine

## 2022-12-28 ENCOUNTER — Encounter: Payer: Self-pay | Admitting: Internal Medicine

## 2022-12-28 VITALS — BP 120/68 | HR 76 | Ht 63.0 in | Wt 170.8 lb

## 2022-12-28 DIAGNOSIS — C73 Malignant neoplasm of thyroid gland: Secondary | ICD-10-CM | POA: Diagnosis not present

## 2022-12-28 DIAGNOSIS — E890 Postprocedural hypothyroidism: Secondary | ICD-10-CM

## 2022-12-28 NOTE — Progress Notes (Unsigned)
Patient ID: Vickie Valdez, female   DOB: 06/20/1956, 67 y.o.   MRN: [id number]  HPI  Vickie Valdez is a 67 y.o.-year-old female, returning for follow-up for thyroid cancer, postsurgical hypothyroidism.  She previously saw Dr. Everardo All, but last visit with me 5 months ago.  Interim hx: She feels well but has fatigue and hot flushes. She had sinusitis 2 weeks ago >> was on Flonase >> resolved. She gained 10 pounds since last visit.  She started to see [facility]. She is thinking about retiring this year.  She will work part-time afterwards.  Follicular variant of papillary thyroid cancer: - dx in 2009.   Reviewed patient's previous history per Epic records and Dr. George Hugh notes; 6/09: thyroidectomy for MNG >> a 1 cm follicular variant of papillary thyroid cancer (margins clear, T1 N0 M0).    8/09: RAI I131 104 mCi 8/09: post-therapy WBS pos only at the neck.   1/10: thyrogen-stim WBS neg  Since last visit, she had the following tests: Neck U/S (01/14/2022): Parenchymal Echotexture: 08/12/2007 preop  Isthmus: Surgically absent  Right lobe: Surgically absent  Left lobe: Surgically absent  __________________________________________________   No residual/recurrent tissue in the thyroidectomy bed. No regional cervical adenopathy noted.   IMPRESSION: No residual/recurrent tissue post thyroidectomy.  Thyrogen stimulated whole-body scan (03/23/2022): No signs to suggest residual/recurrent functioning thyroid tissue.   Reviewed thyroglobulin levels: Lab Results  Component Value Date   THYROGLB 0.2 (L) 06/12/2022   THYROGLB 0.2 (L) 02/23/2022   THYROGLB 0.4 (L) 11/07/2021   THYROGLB 0.2 (L) 02/25/2021   THYROGLB 0.2 (L) 02/03/2021   THYROGLB 0.2 (L) 10/22/2020   THYROGLB 0.2 (L) 02/02/2020   THYROGLB 0.2 (L) 01/20/2019   THYROGLB 0.2 (L) 04/15/2018   THYROGLB 0.2 (L) 07/23/2017   THYROGLB 0.2 (L) 04/22/2017   THYROGLB 0.2 (L) 01/01/2017   THYROGLB 0.1 (L)  12/26/2014   THYROGLB <0.2 12/15/2013   THYROGLB <0.2 12/16/2012   THYROGLB <0.2 12/11/2011   THYROGLB <0.2 07/27/2011   THYROGLB <0.2 12/11/2010   THGAB <1 06/12/2022   THGAB <1 02/23/2022   THGAB <1 11/07/2021   THGAB <1 02/25/2021   THGAB <1 02/03/2021   THGAB <1 10/22/2020   THGAB <1 02/02/2020   THGAB <1 01/20/2019   THGAB <1 04/15/2018   THGAB <1 07/23/2017   THGAB <1 04/22/2017   THGAB <1 01/01/2017   THGAB <1 12/26/2014   THGAB <20.0 12/15/2013   THGAB <20.0 12/16/2012   THGAB <20.0 12/11/2011   THGAB <20.0 07/27/2011   THGAB <20.0 12/11/2010   THGAB <20.0 12/11/2010   THGAB <30.0 U/mL 12/10/2009  5/11:  tg undetectable (ab neg) 11/10: tg undetectable (ab neg) 5/10: tg undetectable (ab neg) 1/10:  tg undetectable (ab neg)  Pt denies: - feeling nodules in neck - hoarseness - dysphagia - choking  Postsurgical hypothyroidism:  She is on Levothyroxine 100 mcg (dose increased 01/2022) -takes 2x50 mcg (intolerance to the dye), taken: - in am - fasting - at least 30 min from b'fast - + calcium <1h after LT4 >> moved to more than 4 hours later - no iron - occasionally in the pm - + multivitamins  <1h after LT4  >> moved to more than 4 hours later - + PPIs (Omeprazole) - moved at night  (prn), prev. in am - not on Biotin  I reviewed pt's thyroid tests: Lab Results  Component Value Date   TSH 0.22 (L) 06/12/2022   TSH 0.54 02/23/2022   TSH  3.80 01/12/2022   TSH 2.63 11/07/2021   TSH 1.15 02/25/2021   TSH 0.17 (L) 02/03/2021   TSH 0.70 10/22/2020   TSH 0.43 02/02/2020   TSH 0.11 (L) 01/20/2019   TSH 1.24 04/15/2018   FREET4 1.36 06/12/2022   FREET4 1.40 02/23/2022   FREET4 1.11 01/12/2022   FREET4 1.05 11/07/2021   FREET4 1.13 02/25/2021   FREET4 1.22 02/03/2021   FREET4 1.17 10/22/2020   FREET4 1.5 02/02/2020   FREET4 1.4 01/20/2019   She mentions hot flushes, but no other sxs.  She has a history of postsurgical hypocalcemia: -  resolved  Reviewed PTH and calcium levels: Reviewed date:03/06/2022 01:34:02 PM Interpretation: Performing Lab: Notes/Report: Testing Performed at: [facility], [address], [address], [place], [place] [address]  Glucose 79 70-99 mg/dL  BUN 16 6-04 mg/dL  Creatinine 5.40 9.81-1.91 mg/dl  [id number] 73 >13 calc  Sodium 142 136-145 mmol/L  Potassium 4.3 3.5-5.5 mmol/L  Chloride 108 98-107 mmol/L  CO2 30 22-32 mmol/L  Anion Gap 8.2 6.0-20.0 mmol/L  Calcium 9.0 8.6-10.3 mg/dL  CA-corrected 0.86 5.78-46.96 mg/dL  Protein, Total 6.4 2.9-5.2 g/dL  Albumin 3.9 8.4-1.3 g/dL  TBIL 0.4 2.4-4.0 mg/dL  ALP 59 10-272 U/L  AST 26 0-39 U/L  ALT 21 0-52 U/L   Lab Results  Component Value Date   PTH 25 11/07/2021   PTH 25 10/22/2020   PTH 20 02/02/2020   PTH 21 01/20/2019   PTH 22 04/15/2018   PTH 29 04/28/2017   PTH CANCELED 04/22/2017   PTH 35.3 12/16/2012   PTH 34.9 07/27/2011   PTH 30.9 12/11/2010   PTH 32.9 12/10/2009   PTH 18.3 06/20/2009   PTH 34.5 08/16/2008   PTH 26.1 05/23/2008   CALCIUM 8.9 11/07/2021   CALCIUM 8.8 10/22/2020   CALCIUM 8.9 10/22/2020   CALCIUM 9.1 02/02/2020   CALCIUM 8.9 01/20/2019   CALCIUM 9.2 04/15/2018   CALCIUM 9.0 04/28/2017   CALCIUM 9.0 04/22/2017   CALCIUM 9.1 12/16/2012   CALCIUM 8.8 07/27/2011   CALCIUM 8.9 12/11/2010   CALCIUM 8.2 (L) 12/10/2009   CALCIUM 8.7 06/20/2009   CALCIUM 8.7 08/16/2008   CALCIUM 9.1 05/23/2008   CALCIUM 7.0 (L) 02/11/2008   CALCIUM 7.3 (L) 02/10/2008   CALCIUM 7.9 (L) 02/09/2008   CALCIUM 9.4 02/06/2008   Reviewed vitamin D levels: Lab Results  Component Value Date   VD25OH 40.31 06/12/2022   VD25OH 37.97 11/07/2021   VD25OH 56.33 02/03/2021   VD25OH 51.75 10/22/2020   VD25OH 23 (L) 02/02/2020   VD25OH 47 01/20/2019   VD25OH 38.79 04/15/2018   VD25OH 50.68 07/23/2017   VD25OH 25.81 (L) 04/22/2017   VD25OH 33 12/15/2013  She takes 1000 units vitamin D daily.  She has + FH of thyroid disorders  in: sister - HTyr. No FH of thyroid cancer.  No h/o radiation tx to head or neck.  She also has a history of IBS, GERD, obesity.  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   HYPOTHYROIDISM, POSTSURGICAL 02/20/2008   IBS (irritable bowel syndrome)    Multinodular goiter    Obesity    Other dysphagia 11/09/2007   THYROID CANCER 02/20/2008   stage 1 papillary adenocarcinoma, follicular variant 06/09: thyroidectomy for 10mm papillary ca, follicular variant 08/09: 104 mci I-131 rx , post-therapy scan pos only at the neck 1/10: tg undectectable (ab neg) neg thyrogen scan  05/10: tg undetectable (ab neg) 11/10: tg undetectable (ab neg) 5/11: tg undectectable (ab neg)   Past Surgical History:  Procedure Laterality Date   BIOPSY THYROID  10/14/2007   Thyroid Ultrasound  08/12/2007   Thyroidecctomy     Social History   Socioeconomic History   Marital status: Married    Spouse name: Not on file   Number of children: Not on file   Years of education: Not on file   Highest education level: Not on file  Occupational History   Occupation: Production designer, theatre/television/film     Comment: [employer]  Tobacco Use   Smoking status: Never   Smokeless tobacco: Never  Substance and Sexual Activity   Alcohol use: No   Drug use: No   Sexual activity: Not on file  Other Topics Concern   Not on file  Social History Narrative   Regular exercise-yes   Social Determinants of Health   Financial Resource Strain: Not on file  Food Insecurity: Not on file  Transportation Needs: Not on file  Physical Activity: Not on file  Stress: Not on file  Social Connections: Not on file  Intimate Partner Violence: Not on file   Current Outpatient Medications on File Prior to Visit  Medication Sig Dispense Refill   busPIRone (BUSPAR) 7.5 MG tablet Take 7.5 mg by mouth 2 (two) times daily.      Calcium Carb-Cholecalciferol (CALCIUM 600  + D PO) Take 1 tablet by mouth daily.     Cholecalciferol (VITAMIN D-3) 5000 UNIT/ML LIQD 1 tablet     clidinium-chlordiazePOXIDE (LIBRAX) 2.5-5 MG per capsule Take 1 capsule by mouth 4 (four) times daily as needed.     diclofenac (VOLTAREN) 75 MG EC tablet Take 1 tablet (75 mg total) by mouth 2 (two) times daily. 60 tablet 1   estradiol (ESTRACE) 0.1 MG/GM vaginal cream Place 1 Applicatorful vaginally 3 (three) times a week.     gabapentin (NEURONTIN) 300 MG capsule gabapentin 300 mg capsule  TAKE 1 CAPSULE BY MOUTH EVERYDAY AT BEDTIME     hyoscyamine (LEVSIN, ANASPAZ) 0.125 MG tablet      levothyroxine (SYNTHROID) 100 MCG tablet Take 1 tablet (100 mcg total) by mouth daily. 90 tablet 3   LORazepam (ATIVAN) 0.5 MG tablet Take 1-2 tablets (0.5-1 mg total) by mouth every 4 (four) hours as needed for anxiety. 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 Only take if not taking Librax. 4 tablet 0   Multiple Vitamin (MULTIVITAMIN) tablet Take 1 tablet by mouth daily.     omeprazole (PRILOSEC) 20 MG capsule Take 20 mg by mouth daily.      No current facility-administered medications on file prior to visit.   Allergies  Allergen Reactions   Aspirin     Upset GI System   Prednisone     Per pt "makes her feel strange"   Penicillins Rash   Family History  Problem Relation Age of Onset   Thyroid disease Sister        Hyperthyroidism   PE: BP 120/68 (BP Location: Right Arm, Patient Position: Sitting, Cuff Size: Normal)   Pulse 76   Ht 5\' 3"  (1.6 m)   Wt 170 lb 12.8 oz (77.5 kg)   SpO2 98%   BMI 30.26 kg/m  Wt Readings from Last 3 Encounters:  12/28/22 170 lb 12.8 oz (77.5 kg)  06/12/22 164 lb 6.4 oz (74.6 kg)  01/12/22 165 lb 6.4 oz (75 kg)   Constitutional: overweight, in  NAD Eyes: no exophthalmos ENT: no neck masses palpated, thyroidectomy scar inconspicuous, no cervical lymphadenopathy Cardiovascular: RRR, No MRG Respiratory: CTA B Musculoskeletal: no  deformities Skin: moist, warm, no rashes Neurological: no tremor with outstretched hands  ASSESSMENT: 1.  Follicular variant of thyroid cancer - see HPI  2. Postsurgical Hypothyroidism  PLAN:  1.  Follicular variant of papillary thyroid cancer - she is stage I TNM clinically - I reassured her that papillary thyroid cancer is a slow growing cancer with good prognosis -She had total thyroidectomy by RAI treatment in 2009 -Whole-body scan obtained after treatment only showed uptake in the thyroid remnant, no metastasis -Thyrogen stimulated whole-body scan obtained 1 year later did not show any uptake -Thyroglobulin levels were undetectable until 2016, when they started to become detectable but quite low.  At last check, thyroglobulin was 0.2, very stable since 2018, with undetectable ATA -Before last visit we checked a thyroid ultrasound and this showed no concerning masses in her neck -We checked a Thyrogen-stimulated whole-body scan and this did not show any abnormal uptake -For now, we will continue to monitor her thyroglobulin and ATA but no intervention needed - I will se her back in 8 months  2.  Patient with h/o total thyroidectomy for cancer, now with iatrogenic hypothyroidism, on levothyroxine therapy - latest thyroid labs reviewed with pt. >> TSH slightly low, however, we continued the same dose of LT4 due to the detectable thyroglobulin: Lab Results  Component Value Date   TSH 0.22 (L) 06/12/2022  - she continues on LT4 2 x 50 mcg daily (had tongue tingling and feeling poorly after switching to the 100 mcg LT4 tablet in the past) - pt describes fatigue and hot flashes at today's visit.  Therefore, if the TSH returned suppressed today, we will go ahead and decrease her LT4 dose.  In that case, we can use the 88 mcg tablets, which she used in the past without side effects. - we discussed about taking the thyroid hormone every day, with water, >30 minutes before breakfast,  separated by >4 hours from acid reflux medications, calcium, iron, multivitamins. Pt. is taking it correctly. - will check thyroid tests today: TSH and fT4 - If labs are abnormal, she will need to return for repeat TFTs in 1.5 months  3.  Postsurgical hypocalcemia -Asymptomatic -Calcium was low for few checks after the surgery, then normalized -Latest vitamin D level was normal in 06/2022 and calcium was normal 02/2022 [facility] records -She continues vitamin D 1000 units daily -Will recheck the level at next visit  Carlus Pavlov, MD PhD [facility]

## 2022-12-28 NOTE — Patient Instructions (Addendum)
  Please continue Levothyroxine 100 mcg daily.  Take the thyroid hormone every day, with water, at least 30 minutes before breakfast, separated by at least 4 hours from: - acid reflux medications - calcium - iron - multivitamins  Please stop at the lab.  Please return in 6 months. 

## 2022-12-29 LAB — TSH: TSH: 0.15 u[IU]/mL — ABNORMAL LOW (ref 0.35–5.50)

## 2022-12-29 LAB — T4, FREE: Free T4: 1.06 ng/dL (ref 0.60–1.60)

## 2022-12-29 MED ORDER — LEVOTHYROXINE SODIUM 88 MCG PO TABS
88.0000 ug | ORAL_TABLET | Freq: Every day | ORAL | 3 refills | Status: DC
Start: 2022-12-29 — End: 2023-05-06

## 2023-01-20 ENCOUNTER — Ambulatory Visit (INDEPENDENT_AMBULATORY_CARE_PROVIDER_SITE_OTHER): Payer: BC Managed Care – PPO | Admitting: Podiatry

## 2023-01-20 DIAGNOSIS — M7752 Other enthesopathy of left foot: Secondary | ICD-10-CM

## 2023-01-20 DIAGNOSIS — M79672 Pain in left foot: Secondary | ICD-10-CM

## 2023-01-20 DIAGNOSIS — M205X9 Other deformities of toe(s) (acquired), unspecified foot: Secondary | ICD-10-CM

## 2023-01-20 NOTE — Progress Notes (Signed)
Patient presents today to pick up custom orthotics   Patient was dispensed 1 pair of custom orthotics. Fit was satisfactory. Instructions for break-in and wear was reviewed and a copy was given to the patient.   Patient left a old pair of orthotics to be refurbished, she is aware of $90 charge.

## 2023-02-12 ENCOUNTER — Ambulatory Visit (INDEPENDENT_AMBULATORY_CARE_PROVIDER_SITE_OTHER): Payer: BC Managed Care – PPO | Admitting: Podiatry

## 2023-02-12 ENCOUNTER — Other Ambulatory Visit (INDEPENDENT_AMBULATORY_CARE_PROVIDER_SITE_OTHER): Payer: BC Managed Care – PPO

## 2023-02-12 DIAGNOSIS — M7752 Other enthesopathy of left foot: Secondary | ICD-10-CM

## 2023-02-12 DIAGNOSIS — E890 Postprocedural hypothyroidism: Secondary | ICD-10-CM

## 2023-02-12 LAB — T4, FREE: Free T4: 1.38 ng/dL (ref 0.60–1.60)

## 2023-02-12 LAB — TSH: TSH: 0.28 u[IU]/mL — ABNORMAL LOW (ref 0.35–5.50)

## 2023-02-12 NOTE — Progress Notes (Signed)
Patient presents today to pick up orthotics.   Patient was dispensed 1 pair of orthotics. Fit was satisfactory. Instructions for break-in and wear was reviewed and a copy was given to the patient.  Re-appointment if they should experience any trouble with insoles.

## 2023-02-27 ENCOUNTER — Other Ambulatory Visit: Payer: Self-pay | Admitting: Internal Medicine

## 2023-02-27 DIAGNOSIS — E890 Postprocedural hypothyroidism: Secondary | ICD-10-CM

## 2023-03-17 ENCOUNTER — Telehealth: Payer: Self-pay

## 2023-03-17 NOTE — Telephone Encounter (Signed)
Patient was recently prescribed Estradiol vaginal cream and she wants to know would this have a interaction with her thyroid levels? She did ask the pharmacist and they told her it shouldn't but she wanted to call here and make sure there would not be any interaction. Please advise in Dr. Elvera Lennox absence.

## 2023-03-17 NOTE — Telephone Encounter (Signed)
Patient aware.

## 2023-04-14 ENCOUNTER — Ambulatory Visit: Payer: BC Managed Care – PPO | Admitting: Podiatry

## 2023-04-14 ENCOUNTER — Encounter: Payer: Self-pay | Admitting: Podiatry

## 2023-04-14 ENCOUNTER — Other Ambulatory Visit (INDEPENDENT_AMBULATORY_CARE_PROVIDER_SITE_OTHER): Payer: BC Managed Care – PPO

## 2023-04-14 DIAGNOSIS — Q828 Other specified congenital malformations of skin: Secondary | ICD-10-CM | POA: Diagnosis not present

## 2023-04-14 DIAGNOSIS — E890 Postprocedural hypothyroidism: Secondary | ICD-10-CM

## 2023-04-14 LAB — T4, FREE: Free T4: 1.18 ng/dL (ref 0.60–1.60)

## 2023-04-14 LAB — TSH: TSH: 0.42 u[IU]/mL (ref 0.35–5.50)

## 2023-04-15 NOTE — Progress Notes (Signed)
Subjective:   Patient ID: Vickie Valdez, female   DOB: 67 y.o.   MRN: [id number]   HPI Patient presents with painful callus underneath the left foot x 3   ROS      Objective:  Physical Exam  Neurovascular status intact with keratotic lesion formation x 3 painful     Assessment:  Chronic lesion formation plantar left     Plan:  Debridement of lesion no iatrogenic bleeding reappoint routine care

## 2023-05-05 ENCOUNTER — Other Ambulatory Visit: Payer: Self-pay | Admitting: Internal Medicine

## 2023-05-05 DIAGNOSIS — C73 Malignant neoplasm of thyroid gland: Secondary | ICD-10-CM

## 2023-06-14 ENCOUNTER — Ambulatory Visit: Payer: BC Managed Care – PPO | Admitting: Internal Medicine

## 2023-06-14 ENCOUNTER — Encounter: Payer: Self-pay | Admitting: Internal Medicine

## 2023-06-14 VITALS — BP 136/80 | HR 60 | Ht 63.0 in | Wt 167.6 lb

## 2023-06-14 DIAGNOSIS — E890 Postprocedural hypothyroidism: Secondary | ICD-10-CM | POA: Diagnosis not present

## 2023-06-14 DIAGNOSIS — C73 Malignant neoplasm of thyroid gland: Secondary | ICD-10-CM

## 2023-06-14 LAB — VITAMIN D 25 HYDROXY (VIT D DEFICIENCY, FRACTURES): VITD: 29.07 ng/mL — ABNORMAL LOW (ref 30.00–100.00)

## 2023-06-14 LAB — TSH: TSH: 0.46 u[IU]/mL (ref 0.35–5.50)

## 2023-06-14 NOTE — Progress Notes (Unsigned)
Patient ID: Vickie Valdez, female   DOB: 11-Feb-1956, 67 y.o.   MRN: [id number]  HPI  Vickie Valdez is a 67 y.o.-year-old female, returning for follow-up for thyroid cancer, postsurgical hypothyroidism.  She previously saw Dr. Everardo All, but last visit with me 1 year ago.  Interim hx: She feels well. Fatigue and hot flushes improved. She gained 10 pounds before last visit.  She started to see [facility].  Follicular variant of papillary thyroid cancer: - dx in 2009.   Reviewed patient's previous history per Epic records and Dr. George Hugh notes; 01/2008: thyroidectomy for MNG >> a 1 cm follicular variant of papillary thyroid cancer (margins clear, T1 N0 M0).    03/2008: RAI I131 104 mCi 03/2008: post-therapy WBS pos only at the neck.   08/2008: thyrogen-stim WBS neg  Then, after she started to see me: Neck U/S (01/14/2022): No residual/recurrent tissue in the thyroidectomy bed. No regional cervical adenopathy noted.  Thyrogen stimulated whole-body scan (03/23/2022): No signs to suggest residual/recurrent functioning thyroid tissue.   Reviewed thyroglobulin levels: Lab Results  Component Value Date   THYROGLB 0.2 (L) 06/12/2022   THYROGLB 0.2 (L) 02/23/2022   THYROGLB 0.4 (L) 11/07/2021   THYROGLB 0.2 (L) 02/25/2021   THYROGLB 0.2 (L) 02/03/2021   THYROGLB 0.2 (L) 10/22/2020   THYROGLB 0.2 (L) 02/02/2020   THYROGLB 0.2 (L) 01/20/2019   THYROGLB 0.2 (L) 04/15/2018   THYROGLB 0.2 (L) 07/23/2017   THYROGLB 0.2 (L) 04/22/2017   THYROGLB 0.2 (L) 01/01/2017   THYROGLB 0.1 (L) 12/26/2014   THYROGLB <0.2 12/15/2013   THYROGLB <0.2 12/16/2012   THYROGLB <0.2 12/11/2011   THYROGLB <0.2 07/27/2011   THYROGLB <0.2 12/11/2010   THGAB <1 06/12/2022   THGAB <1 02/23/2022   THGAB <1 11/07/2021   THGAB <1 02/25/2021   THGAB <1 02/03/2021   THGAB <1 10/22/2020   THGAB <1 02/02/2020   THGAB <1 01/20/2019   THGAB <1 04/15/2018   THGAB <1 07/23/2017   THGAB <1  04/22/2017   THGAB <1 01/01/2017   THGAB <1 12/26/2014   THGAB <20.0 12/15/2013   THGAB <20.0 12/16/2012   THGAB <20.0 12/11/2011   THGAB <20.0 07/27/2011   THGAB <20.0 12/11/2010   THGAB <20.0 12/11/2010   THGAB <30.0 U/mL 12/10/2009  5/11:  tg undetectable (ab neg) 11/10: tg undetectable (ab neg) 5/10: tg undetectable (ab neg) 1/10:  tg undetectable (ab neg)  Pt denies: - feeling nodules in neck - hoarseness - dysphagia - choking  Postsurgical hypothyroidism:  She is on Levothyroxine 88 mcg daily, decreased 12/2022: - in am - fasting - at least 30 min from b'fast - + calcium <1h after LT4 >> moved to more than 4 hours later - no iron - occasionally in the pm - + multivitamins  <1h after LT4  >> moved to more than 4 hours later - + PPIs (Omeprazole) - moved at night  (prn), prev. in am - not on Biotin  I reviewed pt's thyroid tests: Lab Results  Component Value Date   TSH 0.42 04/14/2023   TSH 0.28 (L) 02/12/2023   TSH 0.15 (L) 12/28/2022   TSH 0.22 (L) 06/12/2022   TSH 0.54 02/23/2022   TSH 3.80 01/12/2022   TSH 2.63 11/07/2021   TSH 1.15 02/25/2021   TSH 0.17 (L) 02/03/2021   TSH 0.70 10/22/2020   FREET4 1.18 04/14/2023   FREET4 1.38 02/12/2023   FREET4 1.06 12/28/2022   FREET4 1.36 06/12/2022   FREET4 1.40 02/23/2022  FREET4 1.11 01/12/2022   FREET4 1.05 11/07/2021   FREET4 1.13 02/25/2021   FREET4 1.22 02/03/2021   FREET4 1.17 10/22/2020   She has a history of postsurgical hypocalcemia: - resolved  Reviewed PTH and calcium levels: 06/09/2023:   Lab Results  Component Value Date   PTH 25 11/07/2021   PTH 25 10/22/2020   PTH 20 02/02/2020   PTH 21 01/20/2019   PTH 22 04/15/2018   PTH 29 04/28/2017   PTH CANCELED 04/22/2017   PTH 35.3 12/16/2012   PTH 34.9 07/27/2011   PTH 30.9 12/11/2010   PTH 32.9 12/10/2009   PTH 18.3 06/20/2009   PTH 34.5 08/16/2008   PTH 26.1 05/23/2008   CALCIUM 8.9 11/07/2021   CALCIUM 8.8 10/22/2020   CALCIUM  8.9 10/22/2020   CALCIUM 9.1 02/02/2020   CALCIUM 8.9 01/20/2019   CALCIUM 9.2 04/15/2018   CALCIUM 9.0 04/28/2017   CALCIUM 9.0 04/22/2017   CALCIUM 9.1 12/16/2012   CALCIUM 8.8 07/27/2011   CALCIUM 8.9 12/11/2010   CALCIUM 8.2 (L) 12/10/2009   CALCIUM 8.7 06/20/2009   CALCIUM 8.7 08/16/2008   CALCIUM 9.1 05/23/2008   CALCIUM 7.0 (L) 02/11/2008   CALCIUM 7.3 (L) 02/10/2008   CALCIUM 7.9 (L) 02/09/2008   CALCIUM 9.4 02/06/2008   Reviewed vitamin D levels: Lab Results  Component Value Date   VD25OH 40.31 06/12/2022   VD25OH 37.97 11/07/2021   VD25OH 56.33 02/03/2021   VD25OH 51.75 10/22/2020   VD25OH 23 (L) 02/02/2020   VD25OH 47 01/20/2019   VD25OH 38.79 04/15/2018   VD25OH 50.68 07/23/2017   VD25OH 25.81 (L) 04/22/2017   VD25OH 33 12/15/2013  She takes 1000 units vitamin D daily.  She has + FH of thyroid disorders in: sister - HTyr. No FH of thyroid cancer.  No h/o radiation tx to head or neck.  She also has a history of IBS, GERD, obesity.  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   HYPOTHYROIDISM, POSTSURGICAL 02/20/2008   IBS (irritable bowel syndrome)    Multinodular goiter    Obesity    Other dysphagia 11/09/2007   THYROID CANCER 02/20/2008   stage 1 papillary adenocarcinoma, follicular variant 06/09: thyroidectomy for 10mm papillary ca, follicular variant 08/09: 104 mci I-131 rx , post-therapy scan pos only at the neck 1/10: tg undectectable (ab neg) neg thyrogen scan 05/10: tg undetectable (ab neg) 11/10: tg undetectable (ab neg) 5/11: tg undectectable (ab neg)   Past Surgical History:  Procedure Laterality Date   BIOPSY THYROID  10/14/2007   Thyroid Ultrasound  08/12/2007   Thyroidecctomy     Social History   Socioeconomic History   Marital status: Married    Spouse name: Not on file   Number of children: Not on file   Years of education: Not on file    Highest education level: Not on file  Occupational History   Occupation: Production designer, theatre/television/film     Comment: [employer]  Tobacco Use   Smoking status: Never   Smokeless tobacco: Never  Substance and Sexual Activity   Alcohol use: No   Drug use: No   Sexual activity: Not on file  Other Topics Concern   Not on file  Social History Narrative   Regular exercise-yes   Social Determinants of Health   Financial Resource Strain: Not on file  Food Insecurity: Not on file  Transportation Needs: Not on  file  Physical Activity: Not on file  Stress: Not on file  Social Connections: Not on file  Intimate Partner Violence: Not on file   Current Outpatient Medications on File Prior to Visit  Medication Sig Dispense Refill   busPIRone (BUSPAR) 7.5 MG tablet Take 7.5 mg by mouth 2 (two) times daily.      Calcium Carb-Cholecalciferol (CALCIUM 600 + D PO) Take 1 tablet by mouth daily.     Cholecalciferol (VITAMIN D-3) 5000 UNIT/ML LIQD 1 tablet     clidinium-chlordiazePOXIDE (LIBRAX) 2.5-5 MG per capsule Take 1 capsule by mouth 4 (four) times daily as needed.     diclofenac (VOLTAREN) 75 MG EC tablet Take 1 tablet (75 mg total) by mouth 2 (two) times daily. 60 tablet 1   estradiol (ESTRACE) 0.1 MG/GM vaginal cream Place 1 Applicatorful vaginally 3 (three) times a week.     gabapentin (NEURONTIN) 300 MG capsule gabapentin 300 mg capsule  TAKE 1 CAPSULE BY MOUTH EVERYDAY AT BEDTIME     hyoscyamine (LEVSIN, ANASPAZ) 0.125 MG tablet      levothyroxine (SYNTHROID) 50 MCG tablet TAKE 2 TABLETS BY MOUTH DAILY. 180 tablet 3   levothyroxine (SYNTHROID) 88 MCG tablet TAKE 1 TABLET BY MOUTH EVERY DAY 90 tablet 1   LORazepam (ATIVAN) 0.5 MG tablet Take 1-2 tablets (0.5-1 mg total) by mouth every 4 (four) hours as needed for anxiety. 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 Only take if not taking Librax. 4 tablet 0   Multiple Vitamin (MULTIVITAMIN) tablet Take 1 tablet by mouth daily.      omeprazole (PRILOSEC) 20 MG capsule Take 20 mg by mouth daily.      No current facility-administered medications on file prior to visit.   Allergies  Allergen Reactions   Aspirin     Upset GI System   Prednisone     Per pt "makes her feel strange"   Penicillins Rash   Family History  Problem Relation Age of Onset   Thyroid disease Sister        Hyperthyroidism   PE: There were no vitals taken for this visit. Wt Readings from Last 3 Encounters:  12/28/22 170 lb 12.8 oz (77.5 kg)  06/12/22 164 lb 6.4 oz (74.6 kg)  01/12/22 165 lb 6.4 oz (75 kg)   Constitutional: overweight, in NAD Eyes: no exophthalmos ENT: no neck masses palpated, thyroidectomy scar inconspicuous, no cervical lymphadenopathy Cardiovascular: RRR, No MRG Respiratory: CTA B Musculoskeletal: no deformities Skin: moist, warm, no rashes Neurological: no tremor with outstretched hands  ASSESSMENT: 1.  Follicular variant of thyroid cancer - see HPI  2. Postsurgical Hypothyroidism  PLAN:  1.  Follicular variant of papillary thyroid cancer - she is stage I TNM clinically -She had total thyroidectomy followed by RAI treatment in 2009 -Whole-body scan obtained after treatment only showed uptake in the thyroid remnant, no metastasis -Thyrogen stimulated whole-body scan obtained 1 year later did not show any uptake -Thyroglobulin levels were undetectable until 2016, when they started to become detectable, but low.  At last check, thyroglobulin was 0.2, stable since 2019, with undetectable ATA. -We checked a neck ultrasound in 01/2022 and this did not show concerning masses in her neck -We did thyroid stimulated whole-body scan in 03/2022 and this did not show an abnormal uptake -Will continue to monitor her thyroglobulin and ATA -we discussed that, if increasing, we should try to get a CT scan of her neck.  She agrees and would prefer to  have this done by the end of the year, if possible. - I will se her back in 6  months (rather than a year, per her preference)  2.  Patient with h/o total thyroidectomy for cancer, now with iatrogenic hypothyroidism, on levothyroxine therapy - latest thyroid labs reviewed with pt. >> normal: Lab Results  Component Value Date   TSH 0.42 04/14/2023  - she continues on LT4 88 mcg daily, decreased at last visit from 100 mcg (5 x 50 mcg) daily - pt feels good on this dose. - we discussed about taking the thyroid hormone every day, with water, >30 minutes before breakfast, separated by >4 hours from acid reflux medications, calcium, iron, multivitamins. Pt. is taking it correctly. - will check thyroid tests today: TSH and fT4 - If labs are abnormal, she will need to return for repeat TFTs in 1.5 months  3.  Postsurgical hypocalcemia -Asymptomatic -Was low for few checks after the surgery, then normalized -Vitamin D level was normal in 06/2022 and calcium was normal in 05/2023 per [facility] records -She continues vitamin D 1000 units daily -Will recheck the vitamin D level today  Carlus Pavlov, MD PhD [facility]

## 2023-06-14 NOTE — Patient Instructions (Addendum)
Please continue Levothyroxine 88 mcg daily.  Take the thyroid hormone every day, with water, at least 30 minutes before breakfast, separated by at least 4 hours from: - acid reflux medications - calcium - iron - multivitamins  Please stop at the lab.  Look into the "portfolio diet".  Please return in 6 months.

## 2023-06-15 ENCOUNTER — Other Ambulatory Visit (HOSPITAL_COMMUNITY): Payer: Self-pay | Admitting: Internal Medicine

## 2023-06-15 DIAGNOSIS — Z136 Encounter for screening for cardiovascular disorders: Secondary | ICD-10-CM

## 2023-06-15 LAB — THYROGLOBULIN ANTIBODY: Thyroglobulin Ab: <1 [IU]/mL (ref <=1)

## 2023-06-15 LAB — THYROGLOBULIN LEVEL: Thyroglobulin: 0.2 ng/mL — ABNORMAL LOW

## 2023-06-16 ENCOUNTER — Telehealth: Payer: Self-pay

## 2023-06-16 NOTE — Telephone Encounter (Signed)
Lab results and med increase given to patient as directed by MD.

## 2023-06-16 NOTE — Telephone Encounter (Signed)
-----   Message from Carlus Pavlov sent at 06/16/2023 12:54 PM EST ----- Can you please call pt.:  TSH is at goal.  Thyroglobulin is stable, low. Vitamin D slightly low. Please advise her that if she is taking the 1000 units vitamin D consistently, daily, to increase it to 2000 units daily.

## 2023-06-17 ENCOUNTER — Ambulatory Visit (HOSPITAL_COMMUNITY)
Admission: RE | Admit: 2023-06-17 | Discharge: 2023-06-17 | Disposition: A | Discharge status: Home / Self Care | Payer: BC Managed Care – PPO | Source: Ambulatory Visit | Attending: Internal Medicine | Admitting: Internal Medicine

## 2023-06-17 DIAGNOSIS — Z136 Encounter for screening for cardiovascular disorders: Secondary | ICD-10-CM | POA: Insufficient documentation

## 2023-06-21 ENCOUNTER — Encounter (HOSPITAL_COMMUNITY): Payer: Self-pay

## 2023-06-21 ENCOUNTER — Ambulatory Visit (HOSPITAL_COMMUNITY): Payer: BC Managed Care – PPO

## 2023-07-22 ENCOUNTER — Other Ambulatory Visit: Payer: Self-pay | Admitting: Orthopedic Surgery

## 2023-07-22 DIAGNOSIS — M5412 Radiculopathy, cervical region: Secondary | ICD-10-CM

## 2023-07-22 DIAGNOSIS — M5020 Other cervical disc displacement, unspecified cervical region: Secondary | ICD-10-CM

## 2023-07-23 ENCOUNTER — Other Ambulatory Visit: Payer: Self-pay | Admitting: Orthopedic Surgery

## 2023-07-23 DIAGNOSIS — M5126 Other intervertebral disc displacement, lumbar region: Secondary | ICD-10-CM

## 2023-07-23 DIAGNOSIS — M5416 Radiculopathy, lumbar region: Secondary | ICD-10-CM

## 2023-07-25 ENCOUNTER — Ambulatory Visit
Admission: RE | Admit: 2023-07-25 | Discharge: 2023-07-25 | Disposition: A | Discharge status: Home / Self Care | Payer: BC Managed Care – PPO | Source: Ambulatory Visit | Attending: Orthopedic Surgery | Admitting: Orthopedic Surgery

## 2023-07-25 DIAGNOSIS — M5416 Radiculopathy, lumbar region: Secondary | ICD-10-CM

## 2023-07-25 DIAGNOSIS — M5126 Other intervertebral disc displacement, lumbar region: Secondary | ICD-10-CM

## 2023-08-19 ENCOUNTER — Other Ambulatory Visit: Payer: Self-pay | Admitting: Orthopedic Surgery

## 2023-08-19 DIAGNOSIS — M5412 Radiculopathy, cervical region: Secondary | ICD-10-CM

## 2023-08-19 DIAGNOSIS — M5020 Other cervical disc displacement, unspecified cervical region: Secondary | ICD-10-CM

## 2023-08-25 ENCOUNTER — Ambulatory Visit
Admission: RE | Admit: 2023-08-25 | Discharge: 2023-08-25 | Disposition: A | Discharge status: Home / Self Care | Payer: Self-pay | Source: Ambulatory Visit | Attending: Orthopedic Surgery | Admitting: Orthopedic Surgery

## 2023-08-25 DIAGNOSIS — M5412 Radiculopathy, cervical region: Secondary | ICD-10-CM

## 2023-08-25 DIAGNOSIS — M5020 Other cervical disc displacement, unspecified cervical region: Secondary | ICD-10-CM

## 2023-08-31 ENCOUNTER — Other Ambulatory Visit: Payer: Self-pay

## 2023-09-30 ENCOUNTER — Ambulatory Visit: Payer: 59 | Admitting: Internal Medicine

## 2023-09-30 ENCOUNTER — Ambulatory Visit: Payer: BC Managed Care – PPO | Admitting: Internal Medicine

## 2023-09-30 ENCOUNTER — Encounter: Payer: Self-pay | Admitting: Internal Medicine

## 2023-09-30 VITALS — BP 120/70 | HR 80 | Ht 63.0 in | Wt 170.6 lb

## 2023-09-30 DIAGNOSIS — E890 Postprocedural hypothyroidism: Secondary | ICD-10-CM

## 2023-09-30 DIAGNOSIS — E559 Vitamin D deficiency, unspecified: Secondary | ICD-10-CM | POA: Diagnosis not present

## 2023-09-30 DIAGNOSIS — C73 Malignant neoplasm of thyroid gland: Secondary | ICD-10-CM | POA: Diagnosis not present

## 2023-09-30 MED ORDER — LEVOTHYROXINE SODIUM 88 MCG PO TABS: 88.0000 ug | ORAL_TABLET | Freq: Every day | ORAL | 1 refills | Status: DC

## 2023-09-30 NOTE — Progress Notes (Signed)
 Patient ID: Vickie Valdez, female   DOB: April 27, 1956, 68 y.o.   MRN: [id number]  HPI  Vickie Valdez is a 68 y.o.-year-old female, returning for follow-up for thyroid cancer, postsurgical hypothyroidism.  She previously saw Dr. Everardo All, but last visit with me 2 mo ago.  She returns earlier than the scheduled appointment.  Interim hx: Before last visit she started to go to [facility].   She had both the PNA and the Covid inj in 05/2023 >> not feeling well: fatigue, but no CP, SOB, palpitations, GI sxs. Since last visit, she had neck pain and she had imaging of her cervical spine.  She did not have to have steroid injections.  Follicular variant of papillary thyroid cancer: - dx in 2009.   Reviewed patient's previous history per Epic records and Dr. George Hugh notes: 01/2008: thyroidectomy for MNG >> a 1 cm follicular variant of papillary thyroid cancer (margins clear, T1 N0 M0).    03/2008: RAI I131 104 mCi 03/2008: post-therapy WBS positive only at the neck.   08/2008: thyrogen-stim WBS negative  Then, after she started to see me: Neck U/S (01/14/2022): No residual/recurrent tissue in the thyroidectomy bed. No regional cervical adenopathy noted.  Thyrogen stimulated whole-body scan (03/23/2022): No signs to suggest residual/recurrent functioning thyroid tissue.   Reviewed thyroglobulin levels: Lab Results  Component Value Date   THYROGLB 0.2 (L) 06/14/2023   THYROGLB 0.2 (L) 06/12/2022   THYROGLB 0.2 (L) 02/23/2022   THYROGLB 0.4 (L) 11/07/2021   THYROGLB 0.2 (L) 02/25/2021   THYROGLB 0.2 (L) 02/03/2021   THYROGLB 0.2 (L) 10/22/2020   THYROGLB 0.2 (L) 02/02/2020   THYROGLB 0.2 (L) 01/20/2019   THYROGLB 0.2 (L) 04/15/2018   THYROGLB 0.2 (L) 07/23/2017   THYROGLB 0.2 (L) 04/22/2017   THYROGLB 0.2 (L) 01/01/2017   THYROGLB 0.1 (L) 12/26/2014   THYROGLB <0.2 12/15/2013   THYROGLB <0.2 12/16/2012   THYROGLB <0.2 12/11/2011   THYROGLB <0.2 07/27/2011   THYROGLB  <0.2 12/11/2010   THGAB <1 06/14/2023   THGAB <1 06/12/2022   THGAB <1 02/23/2022   THGAB <1 11/07/2021   THGAB <1 02/25/2021   THGAB <1 02/03/2021   THGAB <1 10/22/2020   THGAB <1 02/02/2020   THGAB <1 01/20/2019   THGAB <1 04/15/2018   THGAB <1 07/23/2017   THGAB <1 04/22/2017   THGAB <1 01/01/2017   THGAB <1 12/26/2014   THGAB <20.0 12/15/2013   THGAB <20.0 12/16/2012   THGAB <20.0 12/11/2011   THGAB <20.0 07/27/2011   THGAB <20.0 12/11/2010   THGAB <20.0 12/11/2010  5/11:  tg undetectable (ab neg) 11/10: tg undetectable (ab neg) 5/10: tg undetectable (ab neg) 1/10:  tg undetectable (ab neg)  Pt denies: - feeling nodules in neck - hoarseness - dysphagia - choking  Postsurgical hypothyroidism:  She is on Levothyroxine 88 mcg daily, decreased 12/2022: - in am - fasting - at least 30 min from b'fast - + calcium moved to >4 hours later - no iron  - + multivitamins  moved to >4 hours later - + PPIs (Omeprazole) - moved at night  (prn) - not on Biotin  I reviewed pt's thyroid tests: Lab Results  Component Value Date   TSH 0.46 06/14/2023   TSH 0.42 04/14/2023   TSH 0.28 (L) 02/12/2023   TSH 0.15 (L) 12/28/2022   TSH 0.22 (L) 06/12/2022   TSH 0.54 02/23/2022   TSH 3.80 01/12/2022   TSH 2.63 11/07/2021   TSH 1.15 02/25/2021  TSH 0.17 (L) 02/03/2021   FREET4 1.18 04/14/2023   FREET4 1.38 02/12/2023   FREET4 1.06 12/28/2022   FREET4 1.36 06/12/2022   FREET4 1.40 02/23/2022   FREET4 1.11 01/12/2022   FREET4 1.05 11/07/2021   FREET4 1.13 02/25/2021   FREET4 1.22 02/03/2021   FREET4 1.17 10/22/2020   She has a history of postsurgical hypocalcemia: - resolved  Reviewed PTH and calcium levels: 06/09/2023:   Lab Results  Component Value Date   PTH 25 11/07/2021   PTH 25 10/22/2020   PTH 20 02/02/2020   PTH 21 01/20/2019   PTH 22 04/15/2018   PTH 29 04/28/2017   PTH CANCELED 04/22/2017   PTH 35.3 12/16/2012   PTH 34.9 07/27/2011   PTH 30.9  12/11/2010   PTH 32.9 12/10/2009   PTH 18.3 06/20/2009   PTH 34.5 08/16/2008   PTH 26.1 05/23/2008   CALCIUM 8.9 11/07/2021   CALCIUM 8.8 10/22/2020   CALCIUM 8.9 10/22/2020   CALCIUM 9.1 02/02/2020   CALCIUM 8.9 01/20/2019   CALCIUM 9.2 04/15/2018   CALCIUM 9.0 04/28/2017   CALCIUM 9.0 04/22/2017   CALCIUM 9.1 12/16/2012   CALCIUM 8.8 07/27/2011   CALCIUM 8.9 12/11/2010   CALCIUM 8.2 (L) 12/10/2009   CALCIUM 8.7 06/20/2009   CALCIUM 8.7 08/16/2008   CALCIUM 9.1 05/23/2008   CALCIUM 7.0 (L) 02/11/2008   CALCIUM 7.3 (L) 02/10/2008   CALCIUM 7.9 (L) 02/09/2008   CALCIUM 9.4 02/06/2008   Reviewed vitamin D levels: Lab Results  Component Value Date   VD25OH 29.07 (L) 06/14/2023   VD25OH 40.31 06/12/2022   VD25OH 37.97 11/07/2021   VD25OH 56.33 02/03/2021   VD25OH 51.75 10/22/2020   VD25OH 23 (L) 02/02/2020   VD25OH 47 01/20/2019   VD25OH 38.79 04/15/2018   VD25OH 50.68 07/23/2017   VD25OH 25.81 (L) 04/22/2017   VD25OH 33 12/15/2013  She takes 1000 units vitamin D daily.  She has + FH of thyroid disorders in: sister - HTyr. No FH of thyroid cancer.  No h/o radiation tx to head or neck.  She also has a history of IBS, GERD, obesity.  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   HYPOTHYROIDISM, POSTSURGICAL 02/20/2008   IBS (irritable bowel syndrome)    Multinodular goiter    Obesity    Other dysphagia 11/09/2007   THYROID CANCER 02/20/2008   stage 1 papillary adenocarcinoma, follicular variant 06/09: thyroidectomy for 10mm papillary ca, follicular variant 08/09: 104 mci I-131 rx , post-therapy scan pos only at the neck 1/10: tg undectectable (ab neg) neg thyrogen scan 05/10: tg undetectable (ab neg) 11/10: tg undetectable (ab neg) 5/11: tg undectectable (ab neg)   Past Surgical History:  Procedure Laterality Date   BIOPSY THYROID  10/14/2007   Thyroid Ultrasound  08/12/2007    Thyroidecctomy     Social History   Socioeconomic History   Marital status: Married    Spouse name: Not on file   Number of children: Not on file   Years of education: Not on file   Highest education level: Not on file  Occupational History   Occupation: Production designer, theatre/television/film     Comment: [employer]  Tobacco Use   Smoking status: Never   Smokeless tobacco: Never  Substance and Sexual Activity   Alcohol use: No   Drug use: No   Sexual activity: Not on file  Other Topics Concern  Not on file  Social History Narrative   Regular exercise-yes   Social Drivers of Health   Financial Resource Strain: Not on file  Food Insecurity: Not on file  Transportation Needs: Not on file  Physical Activity: Not on file  Stress: Not on file  Social Connections: Not on file  Intimate Partner Violence: Not on file   Current Outpatient Medications on File Prior to Visit  Medication Sig Dispense Refill   busPIRone (BUSPAR) 7.5 MG tablet Take 7.5 mg by mouth 2 (two) times daily.      Calcium Carb-Cholecalciferol (CALCIUM 600 + D PO) Take 1 tablet by mouth daily.     Cholecalciferol (VITAMIN D-3) 5000 UNIT/ML LIQD 1 tablet     clidinium-chlordiazePOXIDE (LIBRAX) 2.5-5 MG per capsule Take 1 capsule by mouth 4 (four) times daily as needed.     diclofenac (VOLTAREN) 75 MG EC tablet Take 1 tablet (75 mg total) by mouth 2 (two) times daily. (Patient not taking: Reported on 06/14/2023) 60 tablet 1   estradiol (ESTRACE) 0.1 MG/GM vaginal cream Place 1 Applicatorful vaginally 3 (three) times a week.     gabapentin (NEURONTIN) 300 MG capsule gabapentin 300 mg capsule  TAKE 1 CAPSULE BY MOUTH EVERYDAY AT BEDTIME (Patient not taking: Reported on 06/14/2023)     hyoscyamine (LEVSIN, ANASPAZ) 0.125 MG tablet      levothyroxine (SYNTHROID) 50 MCG tablet TAKE 2 TABLETS BY MOUTH DAILY. (Patient not taking: Reported on 06/14/2023) 180 tablet 3   levothyroxine (SYNTHROID) 88 MCG tablet TAKE 1 TABLET BY MOUTH EVERY  DAY 90 tablet 1   LORazepam (ATIVAN) 0.5 MG tablet Take 1-2 tablets (0.5-1 mg total) by mouth every 4 (four) hours as needed for anxiety. 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 Only take if not taking Librax. 4 tablet 0   Multiple Vitamin (MULTIVITAMIN) tablet Take 1 tablet by mouth daily.     omeprazole (PRILOSEC) 20 MG capsule Take 20 mg by mouth daily.      No current facility-administered medications on file prior to visit.   Allergies  Allergen Reactions   Aspirin     Upset GI System   Prednisone     Per pt "makes her feel strange"   Penicillins Rash   Family History  Problem Relation Age of Onset   Thyroid disease Sister        Hyperthyroidism   PE: BP 120/70   Pulse 80   Ht 5\' 3"  (1.6 m)   Wt 170 lb 9.6 oz (77.4 kg)   SpO2 98%   BMI 30.22 kg/m  Wt Readings from Last 3 Encounters:  09/30/23 170 lb 9.6 oz (77.4 kg)  06/14/23 167 lb 9.6 oz (76 kg)  12/28/22 170 lb 12.8 oz (77.5 kg)   Constitutional: overweight, in NAD Eyes: no exophthalmos ENT: no neck masses palpated, thyroidectomy scar inconspicuous, no cervical lymphadenopathy Cardiovascular: RRR, No MRG Respiratory: CTA B Musculoskeletal: no deformities Skin: no rashes Neurological: no tremor with outstretched hands  ASSESSMENT: 1.  Follicular variant of thyroid cancer - see HPI  2. Postsurgical Hypothyroidism  PLAN:  1.  Follicular variant of papillary thyroid cancer -She is clinically stage I TNM -She had total thyroidectomy followed by RAI treatment in 2009 -posttreatment WBS showed uptake only in the thyroid remnant, no remote uptake to indicate metastasis - she also had a Thyrogen stimulated WBS obtained 1 year later which did not show any uptake - Tg levels were undetectable until 2016 when they started  to become detectable but low.  They have been stable, at 0.2, since 2018, with only 1 instance in 2023, thyroglobulin was 0.4. ATA are undetectable.  -A neck ultrasound in 01/2022  did not show any concerning masses in her neck. -Thyrogen stimulated WBS in 03/2022 did not show any abnormal uptake -Will continue to monitor her with thyroglobulin levels and imaging.  At last visit, we discussed that the thyroglobulin level is increasing, described CT scan of the neck - I will see her back in 6 months (per her preference, rather than in 1 year)  2.  Patient with h/o total thyroidectomy for cancer, now with iatrogenic hypothyroidism, on levothyroxine therapy - latest thyroid labs reviewed with pt. >> normal: Lab Results  Component Value Date   TSH 0.46 06/14/2023  - she continues on LT4 88 mcg daily - pt feels good on this dose, however, since he had her pneumonia and COVID-vaccine last year, she is feeling more fatigued and she wanted to make sure that her thyroid labs are still at goal - we discussed about taking the thyroid hormone every day, with water, >30 minutes before breakfast, separated by >4 hours from acid reflux medications, calcium, iron, multivitamins. Pt. is taking it correctly. - will check thyroid tests today: TSH and fT4 - If labs are abnormal, she will need to return for repeat TFTs in 1.5 months  3.  Postsurgical hypocalcemia -As symptomatic -Calcium was low for few checks after the surgery, then normalized -Vitamin D level was slightly low at last visit, at 29.07 and I advised her that if she were taking the 1000 units vitamin D consistently, to increase the dose to 1000 units daily -As of now, she is on vitamin D 1000 units daily.  She is not missing doses. -Will recheck the level today  Orders Placed This Encounter  Procedures   TSH   T4, free   Vitamin D, 25-hydroxy   Carlus Pavlov, MD PhD [facility]

## 2023-09-30 NOTE — Patient Instructions (Addendum)
 Please continue Levothyroxine 88 mcg daily.  Take the thyroid hormone every day, with water, at least 30 minutes before breakfast, separated by at least 4 hours from: - acid reflux medications - calcium - iron - multivitamins  Please continue vitamin D 1000 units daily.  Please stop at the lab.  Please return in 6 months.

## 2023-10-01 LAB — TSH: TSH: 1.42 m[IU]/L (ref 0.40–4.50)

## 2023-10-01 LAB — VITAMIN D 25 HYDROXY (VIT D DEFICIENCY, FRACTURES): Vit D, 25-Hydroxy: 43 ng/mL (ref 30–100)

## 2023-10-01 LAB — T4, FREE: Free T4: 1.3 ng/dL (ref 0.8–1.8)

## 2023-10-05 ENCOUNTER — Telehealth: Payer: Self-pay

## 2023-10-05 NOTE — Telephone Encounter (Signed)
 Patient given results  as directed by MD. No further questions at this time.

## 2023-10-05 NOTE — Telephone Encounter (Signed)
-----   Message from Carlus Pavlov sent at 10/01/2023  8:58 AM EST ----- Can you please call pt.: Her thyroid tests and vitamin D level are great!

## 2023-11-24 ENCOUNTER — Encounter: Payer: Self-pay | Admitting: Podiatry

## 2023-11-24 ENCOUNTER — Ambulatory Visit: Payer: Self-pay | Admitting: Podiatry

## 2023-11-24 DIAGNOSIS — Q828 Other specified congenital malformations of skin: Secondary | ICD-10-CM

## 2023-11-24 NOTE — Progress Notes (Signed)
 Subjective:   Patient ID: Vickie Valdez, female   DOB: 68 y.o.   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

## 2023-12-13 ENCOUNTER — Ambulatory Visit: Payer: BC Managed Care – PPO | Admitting: Internal Medicine

## 2024-01-08 ENCOUNTER — Other Ambulatory Visit: Payer: Self-pay | Admitting: Internal Medicine

## 2024-01-08 DIAGNOSIS — C73 Malignant neoplasm of thyroid gland: Secondary | ICD-10-CM

## 2024-03-03 ENCOUNTER — Encounter: Payer: Self-pay | Admitting: Podiatry

## 2024-03-03 ENCOUNTER — Ambulatory Visit: Admitting: Podiatry

## 2024-03-03 ENCOUNTER — Ambulatory Visit (INDEPENDENT_AMBULATORY_CARE_PROVIDER_SITE_OTHER)

## 2024-03-03 DIAGNOSIS — M76822 Posterior tibial tendinitis, left leg: Secondary | ICD-10-CM

## 2024-03-03 DIAGNOSIS — S9032XA Contusion of left foot, initial encounter: Secondary | ICD-10-CM | POA: Diagnosis not present

## 2024-03-03 DIAGNOSIS — Q828 Other specified congenital malformations of skin: Secondary | ICD-10-CM | POA: Diagnosis not present

## 2024-03-05 NOTE — Progress Notes (Signed)
 Subjective:   Patient ID: Vickie Valdez, female   DOB: 68 y.o.   MRN: [id number]   HPI Patient states that she stepped on her foot wrong and that she has developed some discomfort around the left big toe joint and that the third toe has been bothering her.  Also has lesions underneath her foot which can become tender   ROS      Objective:  Physical Exam  Neurovascular status intact with inflammation pain around the first MPJ left with swelling and soreness of the third digit left foot.  Patient also has several lesions plantar aspect both feet     Assessment:  Inflammatory capsulitis left with digital irritation third toe and lesion formation bilateral consistent with porokeratotic lesions     Plan:  H&P reviewed and x-ray taken do not recommend current treatment may require at 1 point for the toe and at this point courtesy debridement of lesions accomplished  X-rays indicate no fracture of the toe appears to be a contusion of the digit

## 2024-03-30 ENCOUNTER — Ambulatory Visit: Payer: 59 | Admitting: Internal Medicine

## 2024-03-30 ENCOUNTER — Encounter: Payer: Self-pay | Admitting: Internal Medicine

## 2024-03-30 VITALS — BP 120/80 | HR 66 | Ht 63.0 in | Wt 173.0 lb

## 2024-03-30 DIAGNOSIS — C73 Malignant neoplasm of thyroid gland: Secondary | ICD-10-CM

## 2024-03-30 DIAGNOSIS — E890 Postprocedural hypothyroidism: Secondary | ICD-10-CM

## 2024-03-30 NOTE — Patient Instructions (Signed)
 Please continue Levothyroxine 88 mcg daily.  Take the thyroid hormone every day, with water, at least 30 minutes before breakfast, separated by at least 4 hours from: - acid reflux medications - calcium - iron - multivitamins  Please continue vitamin D 1000 units daily.  Please stop at the lab.  Please return in 6 months.

## 2024-03-30 NOTE — Progress Notes (Addendum)
 Patient ID: Vickie Valdez, female   DOB: 01/27/1956, 68 y.o.   MRN: [id number]  HPI  Vickie Valdez is a 68 y.o.-year-old female, returning for follow-up for thyroid  cancer, postsurgical hypothyroidism.  She previously saw Dr. Kassie, but last visit with me 6 months ago.  Interim hx: She has occasional hot flushes.  She takes chromium at night to reduce cravings.  She is planning to return to see [facility] sky MD to help with weight loss.  Follicular variant of papillary thyroid  cancer: - dx in 2009.   Reviewed patient's previous history per Epic records and Dr. Laymond notes: 01/2008: thyroidectomy for MNG >> a 1 cm follicular variant of papillary thyroid  cancer (margins clear, T1 N0 M0).    03/2008: RAI I131 104 mCi 03/2008: post-therapy WBS positive only at the neck.   08/2008: thyrogen -stim WBS negative  Then, after she started to see me: Neck U/S (01/14/2022): No residual/recurrent tissue in the thyroidectomy bed. No regional cervical adenopathy noted.  Thyrogen  stimulated whole-body scan (03/23/2022): No signs to suggest residual/recurrent functioning thyroid  tissue.   Reviewed thyroglobulin levels: Lab Results  Component Value Date   THYROGLB 0.2 (L) 06/14/2023   THYROGLB 0.2 (L) 06/12/2022   THYROGLB 0.2 (L) 02/23/2022   THYROGLB 0.4 (L) 11/07/2021   THYROGLB 0.2 (L) 02/25/2021   THYROGLB 0.2 (L) 02/03/2021   THYROGLB 0.2 (L) 10/22/2020   THYROGLB 0.2 (L) 02/02/2020   THYROGLB 0.2 (L) 01/20/2019   THYROGLB 0.2 (L) 04/15/2018   THYROGLB 0.2 (L) 07/23/2017   THYROGLB 0.2 (L) 04/22/2017   THYROGLB 0.2 (L) 01/01/2017   THYROGLB 0.1 (L) 12/26/2014   THYROGLB <0.2 12/15/2013   THYROGLB <0.2 12/16/2012   THYROGLB <0.2 12/11/2011   THYROGLB <0.2 07/27/2011   THYROGLB <0.2 12/11/2010   THGAB <1 06/14/2023   THGAB <1 06/12/2022   THGAB <1 02/23/2022   THGAB <1 11/07/2021   THGAB <1 02/25/2021   THGAB <1 02/03/2021   THGAB <1 10/22/2020   THGAB <1 02/02/2020    THGAB <1 01/20/2019   THGAB <1 04/15/2018   THGAB <1 07/23/2017   THGAB <1 04/22/2017   THGAB <1 01/01/2017   THGAB <1 12/26/2014   THGAB <20.0 12/15/2013   THGAB <20.0 12/16/2012   THGAB <20.0 12/11/2011   THGAB <20.0 07/27/2011   THGAB <20.0 12/11/2010   THGAB <20.0 12/11/2010  5/11:  tg undetectable (ab neg) 11/10: tg undetectable (ab neg) 5/10: tg undetectable (ab neg) 1/10:  tg undetectable (ab neg)  Pt denies: - feeling nodules in neck - hoarseness - dysphagia - choking  Postsurgical hypothyroidism:  She is on Levothyroxine  88 mcg daily, decreased 12/2022: - in am - fasting - at least 30 min from b'fast - + calcium moved to >4 hours later - no iron  - + multivitamins  moved to >4 hours later - + PPIs (Omeprazole) - moved at night  (prn) - not on Biotin  I reviewed pt's thyroid  tests: Lab Results  Component Value Date   TSH 1.42 09/30/2023   TSH 0.46 06/14/2023   TSH 0.42 04/14/2023   TSH 0.28 (L) 02/12/2023   TSH 0.15 (L) 12/28/2022   TSH 0.22 (L) 06/12/2022   TSH 0.54 02/23/2022   TSH 3.80 01/12/2022   TSH 2.63 11/07/2021   TSH 1.15 02/25/2021   FREET4 1.3 09/30/2023   FREET4 1.18 04/14/2023   FREET4 1.38 02/12/2023   FREET4 1.06 12/28/2022   FREET4 1.36 06/12/2022   FREET4 1.40 02/23/2022   FREET4 1.11 01/12/2022  FREET4 1.05 11/07/2021   FREET4 1.13 02/25/2021   FREET4 1.22 02/03/2021   She has a history of postsurgical hypocalcemia: - resolved  Reviewed PTH and calcium levels: 06/09/2023:   Lab Results  Component Value Date   PTH 25 11/07/2021   PTH 25 10/22/2020   PTH 20 02/02/2020   PTH 21 01/20/2019   PTH 22 04/15/2018   PTH 29 04/28/2017   PTH CANCELED 04/22/2017   PTH 35.3 12/16/2012   PTH 34.9 07/27/2011   PTH 30.9 12/11/2010   PTH 32.9 12/10/2009   PTH 18.3 06/20/2009   PTH 34.5 08/16/2008   PTH 26.1 05/23/2008   CALCIUM 8.9 11/07/2021   CALCIUM 8.8 10/22/2020   CALCIUM 8.9 10/22/2020   CALCIUM 9.1 02/02/2020    CALCIUM 8.9 01/20/2019   CALCIUM 9.2 04/15/2018   CALCIUM 9.0 04/28/2017   CALCIUM 9.0 04/22/2017   CALCIUM 9.1 12/16/2012   CALCIUM 8.8 07/27/2011   CALCIUM 8.9 12/11/2010   CALCIUM 8.2 (L) 12/10/2009   CALCIUM 8.7 06/20/2009   CALCIUM 8.7 08/16/2008   CALCIUM 9.1 05/23/2008   CALCIUM 7.0 (L) 02/11/2008   CALCIUM 7.3 (L) 02/10/2008   CALCIUM 7.9 (L) 02/09/2008   CALCIUM 9.4 02/06/2008   Reviewed vitamin D  levels: Lab Results  Component Value Date   VD25OH 43 09/30/2023   VD25OH 29.07 (L) 06/14/2023   VD25OH 40.31 06/12/2022   VD25OH 37.97 11/07/2021   VD25OH 56.33 02/03/2021   VD25OH 51.75 10/22/2020   VD25OH 23 (L) 02/02/2020   VD25OH 47 01/20/2019   VD25OH 38.79 04/15/2018   VD25OH 50.68 07/23/2017   VD25OH 25.81 (L) 04/22/2017   VD25OH 33 12/15/2013  She takes 1000 units vitamin D  daily.  She has + FH of thyroid  disorders in: sister - HTyr. No FH of thyroid  cancer.  No h/o radiation tx to head or neck.  She also has a history of IBS, GERD, obesity.  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   HYPOTHYROIDISM, POSTSURGICAL 02/20/2008   IBS (irritable bowel syndrome)    Multinodular goiter    Obesity    Other dysphagia 11/09/2007   THYROID  CANCER 02/20/2008   stage 1 papillary adenocarcinoma, follicular variant 06/09: thyroidectomy for 10mm papillary ca, follicular variant 08/09: 104 mci I-131 rx , post-therapy scan pos only at the neck 1/10: tg undectectable (ab neg) neg thyrogen  scan 05/10: tg undetectable (ab neg) 11/10: tg undetectable (ab neg) 5/11: tg undectectable (ab neg)   Past Surgical History:  Procedure Laterality Date   BIOPSY THYROID   10/14/2007   Thyroid  Ultrasound  08/12/2007   Thyroidecctomy     Social History   Socioeconomic History   Marital status: Married    Spouse name: Not on file   Number of children: Not on file   Years of education: Not on  file   Highest education level: Not on file  Occupational History   Occupation: Production designer, theatre/television/film     Comment: [employer]  Tobacco Use   Smoking status: Never   Smokeless tobacco: Never  Substance and Sexual Activity   Alcohol use: No   Drug use: No   Sexual activity: Not on file  Other Topics Concern   Not on file  Social History Narrative   Regular exercise-yes   Social Drivers of Health   Financial Resource Strain: Not on file  Food Insecurity: Not on file  Transportation Needs: Not  on file  Physical Activity: Not on file  Stress: Not on file  Social Connections: Not on file  Intimate Partner Violence: Not on file   Current Outpatient Medications on File Prior to Visit  Medication Sig Dispense Refill   busPIRone (BUSPAR) 7.5 MG tablet Take 7.5 mg by mouth 2 (two) times daily.      Calcium Carb-Cholecalciferol (CALCIUM 600 + D PO) Take 1 tablet by mouth daily.     Cholecalciferol (VITAMIN D -3) 5000 UNIT/ML LIQD 1 tablet     clidinium-chlordiazePOXIDE (LIBRAX) 2.5-5 MG per capsule Take 1 capsule by mouth 4 (four) times daily as needed.     diclofenac  (VOLTAREN ) 75 MG EC tablet Take 1 tablet (75 mg total) by mouth 2 (two) times daily. 60 tablet 1   estradiol  (ESTRACE ) 0.1 MG/GM vaginal cream Place 1 Applicatorful vaginally 3 (three) times a week.     gabapentin (NEURONTIN) 300 MG capsule      hyoscyamine (LEVSIN, ANASPAZ) 0.125 MG tablet      levothyroxine  (SYNTHROID ) 88 MCG tablet TAKE 1 TABLET BY MOUTH EVERY DAY 90 tablet 1   LORazepam  (ATIVAN ) 0.5 MG tablet Take 1-2 tablets (0.5-1 mg total) by mouth every 4 (four) hours as needed for anxiety. 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 Only take if not taking Librax. 4 tablet 0   Multiple Vitamin (MULTIVITAMIN) tablet Take 1 tablet by mouth daily.     omeprazole (PRILOSEC) 20 MG capsule Take 20 mg by mouth daily.      No current facility-administered medications on file prior to visit.   Allergies   Allergen Reactions   Aspirin     Upset GI System   Prednisone     Per pt makes her feel strange   Penicillins Rash   Family History  Problem Relation Age of Onset   Thyroid  disease Sister        Hyperthyroidism   PE: BP 120/80   Pulse 66   Ht 5' 3 (1.6 m)   Wt 173 lb (78.5 kg)   SpO2 99%   BMI 30.65 kg/m  Wt Readings from Last 3 Encounters:  03/30/24 173 lb (78.5 kg)  09/30/23 170 lb 9.6 oz (77.4 kg)  06/14/23 167 lb 9.6 oz (76 kg)   Constitutional: overweight, in NAD Eyes: no exophthalmos ENT: no neck masses palpated, thyroidectomy scar inconspicuous, no cervical lymphadenopathy Cardiovascular: RRR, No MRG Respiratory: CTA B Musculoskeletal: no deformities Skin: no rashes Neurological: no tremor with outstretched hands  ASSESSMENT: 1.  Follicular variant of thyroid  cancer - see HPI  2. Postsurgical Hypothyroidism  PLAN:  1.  Follicular variant of papillary thyroid  cancer -She is clinically stage I TNM - She had total thyroidectomy followed by RAI treatment in 2009.  A posttreatment whole-body scan showed uptake only in the thyroid  remnant, with no remote uptake to indicate metastasis.  She had another thyroid  stimulated WBS 1 year later, which did not show any abnormal uptake. - Thyroglobulin levels were undetectable and in 2016 when they started to become detectable but low.  They have been stable, at 0.2 since 2018, with only 1 instance in 2023 when thyroglobulin was 0.4.  Her ATA antibodies are undetectable.  - Neck ultrasound in 01/2022 did not show any concerning masses in her neck - A Thyrogen  stimulated whole-body scan in 03/2022 did not show any abnormal uptake - Will continue to monitor her with thyroglobulin levels and imaging -will repeat her thyroglobulin now - Will see her back  in 6 months, per her preference, rather than a year  2.  Patient with h/o total thyroidectomy for cancer, now with iatrogenic hypothyroidism, on levothyroxine  therapy -  latest thyroid  labs reviewed with pt. >> normal: Lab Results  Component Value Date   TSH 1.42 09/30/2023  - she continues on LT4 88 mcg daily - pt feels good on this dose. - we discussed about taking the thyroid  hormone every day, with water, >30 minutes before breakfast, separated by >4 hours from acid reflux medications, calcium, iron, multivitamins. Pt. is taking it correctly. - will check her TSH today  3.  Postsurgical hypocalcemia - Asymptomatic - He had low calcium levels for few checks after the surgery, but then normalized - Latest calcium level was normal, at 9.0 in 05/2023 - Latest vitamin D  level was normal, at 43, in 09/2023 - Will continue 1000 units vitamin D  daily  Needs refills.  Component     Latest Ref Rng 03/30/2024  TSH     0.40 - 4.50 mIU/L 0.72   Thyroglobulin Ab     < or = 1 IU/mL <1   Thyroglobulin     ng/mL 0.2 (L)   Comment -    TSH at goal. Tg stable.  Lela Fendt, MD PhD [facility]

## 2024-04-03 LAB — TSH: TSH: 0.72 m[IU]/L (ref 0.40–4.50)

## 2024-04-03 LAB — THYROGLOBULIN LEVEL: Thyroglobulin: 0.2 ng/mL — ABNORMAL LOW

## 2024-04-03 LAB — THYROGLOBULIN ANTIBODY: Thyroglobulin Ab: <1 [IU]/mL (ref <=1)

## 2024-04-04 ENCOUNTER — Ambulatory Visit: Payer: Self-pay | Admitting: Internal Medicine

## 2024-04-04 MED ORDER — LEVOTHYROXINE SODIUM 88 MCG PO TABS: 88.0000 ug | ORAL_TABLET | Freq: Every day | ORAL | 3 refills | Status: AC

## 2024-04-04 NOTE — Addendum Note (Signed)
 Addended by: TRIXIE FILE on: 04/04/2024 12:31 PM   Modules accepted: Orders

## 2024-06-30 ENCOUNTER — Emergency Department (HOSPITAL_COMMUNITY)

## 2024-06-30 ENCOUNTER — Other Ambulatory Visit: Payer: Self-pay

## 2024-06-30 ENCOUNTER — Encounter (HOSPITAL_COMMUNITY): Payer: Self-pay | Admitting: Emergency Medicine

## 2024-06-30 ENCOUNTER — Emergency Department (HOSPITAL_COMMUNITY)
Admission: EM | Admit: 2024-06-30 | Discharge: 2024-06-30 | Disposition: A | Discharge status: Home / Self Care | Attending: Emergency Medicine | Admitting: Emergency Medicine

## 2024-06-30 DIAGNOSIS — Y92410 Unspecified street and highway as the place of occurrence of the external cause: Secondary | ICD-10-CM | POA: Insufficient documentation

## 2024-06-30 DIAGNOSIS — Z23 Encounter for immunization: Secondary | ICD-10-CM | POA: Insufficient documentation

## 2024-06-30 DIAGNOSIS — S6991XA Unspecified injury of right wrist, hand and finger(s), initial encounter: Secondary | ICD-10-CM | POA: Diagnosis present

## 2024-06-30 DIAGNOSIS — S1181XA Laceration without foreign body of other specified part of neck, initial encounter: Secondary | ICD-10-CM | POA: Insufficient documentation

## 2024-06-30 DIAGNOSIS — S0181XA Laceration without foreign body of other part of head, initial encounter: Secondary | ICD-10-CM | POA: Insufficient documentation

## 2024-06-30 DIAGNOSIS — S0191XA Laceration without foreign body of unspecified part of head, initial encounter: Secondary | ICD-10-CM

## 2024-06-30 DIAGNOSIS — S52501A Unspecified fracture of the lower end of right radius, initial encounter for closed fracture: Secondary | ICD-10-CM | POA: Insufficient documentation

## 2024-06-30 MED ORDER — LIDOCAINE-EPINEPHRINE (PF) 2 %-1:200000 IJ SOLN: 20.0000 mL | Freq: Once | INTRAMUSCULAR | Status: AC

## 2024-06-30 MED ORDER — MUPIROCIN CALCIUM 2 % EX CREA: 1.0000 | TOPICAL_CREAM | Freq: Two times a day (BID) | CUTANEOUS | 0 refills | Status: AC

## 2024-06-30 MED ORDER — OXYCODONE-ACETAMINOPHEN 5-325 MG PO TABS: 1.0000 | ORAL_TABLET | Freq: Four times a day (QID) | ORAL | 0 refills | Status: AC | PRN

## 2024-06-30 MED ORDER — LIDOCAINE-EPINEPHRINE (PF) 2 %-1:200000 IJ SOLN
INTRAMUSCULAR | Status: AC
  Administered 2024-06-30: 20 mL via INTRADERMAL
  Filled 2024-06-30: qty 20

## 2024-06-30 MED ORDER — LIDOCAINE-EPINEPHRINE 2 %-1:100000 IJ SOLN
20.0000 mL | Freq: Once | INTRAMUSCULAR | Status: DC
  Filled 2024-06-30: qty 20

## 2024-06-30 MED ORDER — TETANUS-DIPHTH-ACELL PERTUSSIS 5-2-15.5 LF-MCG/0.5 IM SUSP
0.5000 mL | Freq: Once | INTRAMUSCULAR | Status: AC
  Administered 2024-06-30: 0.5 mL via INTRAMUSCULAR
  Filled 2024-06-30: qty 0.5

## 2024-06-30 MED ORDER — BACITRACIN ZINC 500 UNIT/GM EX OINT
TOPICAL_OINTMENT | Freq: Two times a day (BID) | CUTANEOUS | Status: DC
  Administered 2024-06-30: 1 via TOPICAL
  Filled 2024-06-30: qty 0.9
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
## 2024-06-30 NOTE — ED Notes (Signed)
 Ortho tech at bedside
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
## 2024-06-30 NOTE — Progress Notes (Signed)
 Orthopedic Tech Progress Note Patient Details:  Bailynn Dyk 1955-12-07 [id number]  Ortho Devices Type of Ortho Device: Shoulder immobilizer, Sugartong splint Ortho Device/Splint Location: RUE Ortho Device/Splint Interventions: Ordered, Application, Adjustment   Post Interventions Patient Tolerated: Well Instructions Provided: Adjustment of device, Care of device  Adine MARLA Blush 06/30/2024, 2:03 PM

## 2024-06-30 NOTE — ED Notes (Signed)
 Abx ointment and dressing applied to sutures. Educated pt and daughter on changing dressing at home.

## 2024-06-30 NOTE — ED Notes (Signed)
 Pt to CT
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
## 2024-08-07 ENCOUNTER — Ambulatory Visit: Admitting: Podiatry

## 2024-08-07 ENCOUNTER — Encounter: Payer: Self-pay | Admitting: Podiatry

## 2024-08-07 DIAGNOSIS — Q828 Other specified congenital malformations of skin: Secondary | ICD-10-CM | POA: Diagnosis not present

## 2024-08-07 NOTE — Progress Notes (Signed)
 Subjective:   Patient ID: Vickie Valdez, female   DOB: 69 y.o.   MRN: [id number]   HPI Patient presents with lesions plantar left painful   ROS      Objective:  Physical Exam  Neurovascular status intact lucent lesions present plantar left     Assessment:  Chronic lesion formation left     Plan:  Debridement painful lesions left no iatrogenic bleeding

## 2024-10-03 ENCOUNTER — Ambulatory Visit: Admitting: Internal Medicine
